# Patient Record
Sex: Female | Born: 1975 | Race: Black or African American | Hispanic: No | Marital: Married | Smoking: Current every day smoker
Health system: Southern US, Community
[De-identification: ages and names within clinical notes are randomized; demographics above are authoritative.]

## PROBLEM LIST (undated history)

## (undated) DIAGNOSIS — D649 Anemia, unspecified: Secondary | ICD-10-CM

## (undated) HISTORY — PX: TUBAL LIGATION: SHX77

---

## 1999-08-29 ENCOUNTER — Encounter: Admission: RE | Admit: 1999-08-29 | Discharge: 1999-08-29 | Payer: Self-pay | Admitting: Family Medicine

## 1999-09-04 ENCOUNTER — Encounter: Admission: RE | Admit: 1999-09-04 | Discharge: 1999-09-04 | Payer: Self-pay | Admitting: Family Medicine

## 1999-09-26 ENCOUNTER — Ambulatory Visit (HOSPITAL_COMMUNITY): Admission: RE | Admit: 1999-09-26 | Discharge: 1999-09-26 | Payer: Self-pay

## 1999-09-28 ENCOUNTER — Emergency Department (HOSPITAL_COMMUNITY): Admission: EM | Admit: 1999-09-28 | Discharge: 1999-09-28 | Payer: Self-pay | Admitting: Emergency Medicine

## 1999-10-09 ENCOUNTER — Encounter: Admission: RE | Admit: 1999-10-09 | Discharge: 1999-10-09 | Payer: Self-pay | Admitting: Family Medicine

## 1999-10-18 ENCOUNTER — Inpatient Hospital Stay (HOSPITAL_COMMUNITY): Admission: AD | Admit: 1999-10-18 | Discharge: 1999-10-18 | Payer: Self-pay | Admitting: Obstetrics

## 1999-11-07 ENCOUNTER — Encounter: Admission: RE | Admit: 1999-11-07 | Discharge: 1999-11-07 | Payer: Self-pay | Admitting: Family Medicine

## 1999-11-29 ENCOUNTER — Encounter: Admission: RE | Admit: 1999-11-29 | Discharge: 1999-11-29 | Payer: Self-pay | Admitting: Family Medicine

## 1999-12-07 ENCOUNTER — Ambulatory Visit (HOSPITAL_COMMUNITY): Admission: RE | Admit: 1999-12-07 | Discharge: 1999-12-07 | Payer: Self-pay

## 1999-12-11 ENCOUNTER — Encounter: Admission: RE | Admit: 1999-12-11 | Discharge: 1999-12-11 | Payer: Self-pay | Admitting: Sports Medicine

## 1999-12-29 ENCOUNTER — Encounter: Admission: RE | Admit: 1999-12-29 | Discharge: 1999-12-29 | Payer: Self-pay | Admitting: Sports Medicine

## 2000-01-01 ENCOUNTER — Ambulatory Visit (HOSPITAL_COMMUNITY): Admission: RE | Admit: 2000-01-01 | Discharge: 2000-01-01 | Payer: Self-pay

## 2000-01-20 ENCOUNTER — Inpatient Hospital Stay (HOSPITAL_COMMUNITY): Admission: AD | Admit: 2000-01-20 | Discharge: 2000-01-20 | Payer: Self-pay | Admitting: *Deleted

## 2000-01-22 ENCOUNTER — Encounter: Admission: RE | Admit: 2000-01-22 | Discharge: 2000-01-22 | Payer: Self-pay | Admitting: Family Medicine

## 2000-01-29 ENCOUNTER — Ambulatory Visit (HOSPITAL_COMMUNITY): Admission: RE | Admit: 2000-01-29 | Discharge: 2000-01-29 | Payer: Self-pay | Admitting: Sports Medicine

## 2000-02-01 ENCOUNTER — Encounter: Admission: RE | Admit: 2000-02-01 | Discharge: 2000-02-01 | Payer: Self-pay | Admitting: Family Medicine

## 2000-02-04 ENCOUNTER — Inpatient Hospital Stay (HOSPITAL_COMMUNITY): Admission: AD | Admit: 2000-02-04 | Discharge: 2000-02-04 | Payer: Self-pay | Admitting: Obstetrics & Gynecology

## 2000-02-08 ENCOUNTER — Encounter: Admission: RE | Admit: 2000-02-08 | Discharge: 2000-02-08 | Payer: Self-pay | Admitting: Family Medicine

## 2000-02-14 ENCOUNTER — Inpatient Hospital Stay (HOSPITAL_COMMUNITY): Admission: AD | Admit: 2000-02-14 | Discharge: 2000-02-14 | Payer: Self-pay | Admitting: Obstetrics

## 2000-02-15 ENCOUNTER — Encounter: Admission: RE | Admit: 2000-02-15 | Discharge: 2000-02-15 | Payer: Self-pay | Admitting: Family Medicine

## 2000-02-15 ENCOUNTER — Ambulatory Visit (HOSPITAL_COMMUNITY): Admission: RE | Admit: 2000-02-15 | Discharge: 2000-02-15 | Payer: Self-pay | Admitting: Obstetrics

## 2000-02-26 ENCOUNTER — Inpatient Hospital Stay (HOSPITAL_COMMUNITY): Admission: AD | Admit: 2000-02-26 | Discharge: 2000-02-29 | Payer: Self-pay | Admitting: Obstetrics

## 2000-02-26 ENCOUNTER — Encounter (INDEPENDENT_AMBULATORY_CARE_PROVIDER_SITE_OTHER): Payer: Self-pay | Admitting: Specialist

## 2000-03-01 ENCOUNTER — Inpatient Hospital Stay (HOSPITAL_COMMUNITY): Admission: AD | Admit: 2000-03-01 | Discharge: 2000-03-01 | Payer: Self-pay | Admitting: Obstetrics & Gynecology

## 2000-04-12 ENCOUNTER — Other Ambulatory Visit: Admission: RE | Admit: 2000-04-12 | Discharge: 2000-04-12 | Payer: Self-pay | Admitting: Gynecology

## 2000-04-12 ENCOUNTER — Encounter: Admission: RE | Admit: 2000-04-12 | Discharge: 2000-04-12 | Payer: Self-pay | Admitting: Family Medicine

## 2000-07-16 ENCOUNTER — Encounter: Admission: RE | Admit: 2000-07-16 | Discharge: 2000-07-16 | Payer: Self-pay | Admitting: Family Medicine

## 2000-07-16 ENCOUNTER — Other Ambulatory Visit: Admission: RE | Admit: 2000-07-16 | Discharge: 2000-07-16 | Payer: Self-pay | Admitting: Family Medicine

## 2000-07-30 ENCOUNTER — Encounter: Admission: RE | Admit: 2000-07-30 | Discharge: 2000-07-30 | Payer: Self-pay | Admitting: Family Medicine

## 2000-09-10 ENCOUNTER — Encounter: Admission: RE | Admit: 2000-09-10 | Discharge: 2000-09-10 | Payer: Self-pay | Admitting: Family Medicine

## 2000-09-17 ENCOUNTER — Ambulatory Visit (HOSPITAL_COMMUNITY): Admission: RE | Admit: 2000-09-17 | Discharge: 2000-09-17 | Payer: Self-pay

## 2000-09-19 ENCOUNTER — Encounter: Admission: RE | Admit: 2000-09-19 | Discharge: 2000-09-19 | Payer: Self-pay | Admitting: Sports Medicine

## 2000-09-20 ENCOUNTER — Ambulatory Visit (HOSPITAL_COMMUNITY): Admission: RE | Admit: 2000-09-20 | Discharge: 2000-09-20 | Payer: Self-pay | Admitting: *Deleted

## 2000-09-25 ENCOUNTER — Encounter: Admission: RE | Admit: 2000-09-25 | Discharge: 2000-09-25 | Payer: Self-pay | Admitting: Family Medicine

## 2000-11-22 ENCOUNTER — Other Ambulatory Visit: Admission: RE | Admit: 2000-11-22 | Discharge: 2000-11-22 | Payer: Self-pay | Admitting: *Deleted

## 2000-11-22 ENCOUNTER — Encounter: Admission: RE | Admit: 2000-11-22 | Discharge: 2000-11-22 | Payer: Self-pay | Admitting: Obstetrics & Gynecology

## 2000-11-22 ENCOUNTER — Encounter (INDEPENDENT_AMBULATORY_CARE_PROVIDER_SITE_OTHER): Payer: Self-pay | Admitting: *Deleted

## 2001-05-12 ENCOUNTER — Emergency Department (HOSPITAL_COMMUNITY): Admission: EM | Admit: 2001-05-12 | Discharge: 2001-05-13 | Payer: Self-pay

## 2001-05-13 ENCOUNTER — Encounter: Payer: Self-pay | Admitting: Emergency Medicine

## 2001-05-13 ENCOUNTER — Emergency Department (HOSPITAL_COMMUNITY): Admission: EM | Admit: 2001-05-13 | Discharge: 2001-05-13 | Payer: Self-pay | Admitting: Emergency Medicine

## 2001-12-22 ENCOUNTER — Emergency Department (HOSPITAL_COMMUNITY): Admission: EM | Admit: 2001-12-22 | Discharge: 2001-12-22 | Payer: Self-pay | Admitting: Emergency Medicine

## 2001-12-28 ENCOUNTER — Emergency Department (HOSPITAL_COMMUNITY): Admission: EM | Admit: 2001-12-28 | Discharge: 2001-12-28 | Payer: Self-pay | Admitting: Emergency Medicine

## 2002-01-25 ENCOUNTER — Emergency Department (HOSPITAL_COMMUNITY): Admission: EM | Admit: 2002-01-25 | Discharge: 2002-01-25 | Payer: Self-pay | Admitting: *Deleted

## 2002-01-25 ENCOUNTER — Encounter: Payer: Self-pay | Admitting: *Deleted

## 2002-02-25 ENCOUNTER — Encounter: Admission: RE | Admit: 2002-02-25 | Discharge: 2002-02-25 | Payer: Self-pay | Admitting: Family Medicine

## 2002-08-27 ENCOUNTER — Encounter: Admission: RE | Admit: 2002-08-27 | Discharge: 2002-08-27 | Payer: Self-pay | Admitting: Family Medicine

## 2003-02-17 ENCOUNTER — Emergency Department (HOSPITAL_COMMUNITY): Admission: EM | Admit: 2003-02-17 | Discharge: 2003-02-17 | Payer: Self-pay | Admitting: Emergency Medicine

## 2003-02-24 ENCOUNTER — Encounter: Admission: RE | Admit: 2003-02-24 | Discharge: 2003-02-24 | Payer: Self-pay | Admitting: Sports Medicine

## 2003-05-07 ENCOUNTER — Emergency Department (HOSPITAL_COMMUNITY): Admission: EM | Admit: 2003-05-07 | Discharge: 2003-05-07 | Payer: Self-pay | Admitting: Emergency Medicine

## 2003-12-29 ENCOUNTER — Ambulatory Visit: Payer: Self-pay | Admitting: Family Medicine

## 2004-01-23 ENCOUNTER — Encounter (INDEPENDENT_AMBULATORY_CARE_PROVIDER_SITE_OTHER): Payer: Self-pay | Admitting: *Deleted

## 2004-01-28 ENCOUNTER — Ambulatory Visit: Payer: Self-pay | Admitting: Family Medicine

## 2004-03-01 ENCOUNTER — Ambulatory Visit: Payer: Self-pay | Admitting: Family Medicine

## 2005-03-22 ENCOUNTER — Ambulatory Visit (HOSPITAL_BASED_OUTPATIENT_CLINIC_OR_DEPARTMENT_OTHER): Admission: RE | Admit: 2005-03-22 | Discharge: 2005-03-22 | Payer: Self-pay | Admitting: Orthopedic Surgery

## 2006-03-22 ENCOUNTER — Encounter (INDEPENDENT_AMBULATORY_CARE_PROVIDER_SITE_OTHER): Payer: Self-pay | Admitting: *Deleted

## 2006-05-21 ENCOUNTER — Emergency Department (HOSPITAL_COMMUNITY): Admission: EM | Admit: 2006-05-21 | Discharge: 2006-05-21 | Payer: Self-pay | Admitting: Emergency Medicine

## 2007-06-23 ENCOUNTER — Telehealth: Payer: Self-pay | Admitting: *Deleted

## 2007-08-19 ENCOUNTER — Ambulatory Visit: Payer: Self-pay | Admitting: Family Medicine

## 2007-08-19 DIAGNOSIS — M654 Radial styloid tenosynovitis [de Quervain]: Secondary | ICD-10-CM

## 2007-08-19 DIAGNOSIS — F329 Major depressive disorder, single episode, unspecified: Secondary | ICD-10-CM

## 2007-08-19 DIAGNOSIS — E669 Obesity, unspecified: Secondary | ICD-10-CM

## 2007-09-25 ENCOUNTER — Ambulatory Visit: Payer: Self-pay | Admitting: Family Medicine

## 2007-09-25 ENCOUNTER — Telehealth (INDEPENDENT_AMBULATORY_CARE_PROVIDER_SITE_OTHER): Payer: Self-pay | Admitting: Family Medicine

## 2007-10-10 ENCOUNTER — Emergency Department (HOSPITAL_COMMUNITY): Admission: EM | Admit: 2007-10-10 | Discharge: 2007-10-10 | Payer: Self-pay | Admitting: Emergency Medicine

## 2007-10-23 ENCOUNTER — Ambulatory Visit: Payer: Self-pay | Admitting: Family Medicine

## 2007-10-23 DIAGNOSIS — F17200 Nicotine dependence, unspecified, uncomplicated: Secondary | ICD-10-CM

## 2007-11-24 ENCOUNTER — Telehealth (INDEPENDENT_AMBULATORY_CARE_PROVIDER_SITE_OTHER): Payer: Self-pay | Admitting: Family Medicine

## 2009-06-06 ENCOUNTER — Emergency Department (HOSPITAL_COMMUNITY): Admission: EM | Admit: 2009-06-06 | Discharge: 2009-06-06 | Payer: Self-pay | Admitting: Emergency Medicine

## 2010-01-10 ENCOUNTER — Ambulatory Visit: Payer: Self-pay | Admitting: Family Medicine

## 2010-01-10 ENCOUNTER — Encounter: Payer: Self-pay | Admitting: *Deleted

## 2010-01-10 ENCOUNTER — Encounter
Admission: RE | Admit: 2010-01-10 | Discharge: 2010-01-10 | Payer: Self-pay | Source: Home / Self Care | Attending: Family Medicine | Admitting: Family Medicine

## 2010-01-10 ENCOUNTER — Encounter: Payer: Self-pay | Admitting: Family Medicine

## 2010-01-10 DIAGNOSIS — R609 Edema, unspecified: Secondary | ICD-10-CM

## 2010-01-10 DIAGNOSIS — R51 Headache: Secondary | ICD-10-CM

## 2010-01-10 DIAGNOSIS — R5381 Other malaise: Secondary | ICD-10-CM | POA: Insufficient documentation

## 2010-01-10 DIAGNOSIS — R519 Headache, unspecified: Secondary | ICD-10-CM | POA: Insufficient documentation

## 2010-01-10 DIAGNOSIS — R5383 Other fatigue: Secondary | ICD-10-CM

## 2010-01-10 LAB — CONVERTED CEMR LAB
BUN: 12 mg/dL (ref 6–23)
Basophils Absolute: 0 10*3/uL (ref 0.0–0.1)
Basophils Relative: 1 % (ref 0–1)
CO2: 21 meq/L (ref 19–32)
Calcium: 9.1 mg/dL (ref 8.4–10.5)
Chloride: 107 meq/L (ref 96–112)
Creatinine, Ser: 0.7 mg/dL (ref 0.40–1.20)
Eosinophils Absolute: 0.3 10*3/uL (ref 0.0–0.7)
Eosinophils Relative: 3 % (ref 0–5)
Free T4: 1.01 ng/dL (ref 0.80–1.80)
Glucose, Bld: 107 mg/dL — ABNORMAL HIGH (ref 70–99)
HCT: 40.8 % (ref 36.0–46.0)
Hemoglobin: 12.5 g/dL (ref 12.0–15.0)
Lymphocytes Relative: 39 % (ref 12–46)
Lymphs Abs: 2.9 10*3/uL (ref 0.7–4.0)
MCHC: 30.6 g/dL (ref 30.0–36.0)
MCV: 92.5 fL (ref 78.0–100.0)
Monocytes Absolute: 0.4 10*3/uL (ref 0.1–1.0)
Monocytes Relative: 6 % (ref 3–12)
Neutro Abs: 3.9 10*3/uL (ref 1.7–7.7)
Neutrophils Relative %: 52 % (ref 43–77)
Platelets: 383 10*3/uL (ref 150–400)
Potassium: 4.2 meq/L (ref 3.5–5.3)
RBC: 4.41 M/uL (ref 3.87–5.11)
RDW: 15.7 % — ABNORMAL HIGH (ref 11.5–15.5)
Sodium: 141 meq/L (ref 135–145)
TSH: 1.291 microintl units/mL (ref 0.350–4.500)
WBC: 7.4 10*3/uL (ref 4.0–10.5)

## 2010-02-23 NOTE — Assessment & Plan Note (Signed)
Summary: Headache   Vital Signs:  Patient profile:   35 year old female Height:      60 inches Weight:      215.1 pounds BMI:     42.16 Temp:     97.9 degrees F oral Pulse rate:   112 / minute BP sitting:   128 / 86  (left arm) Cuff size:   regular  Vitals Entered By: Jimmy Footman, CMA (January 10, 2010 10:51 AM) CC: migrane x3 days. swelling under jaw Is Patient Diabetic? No Pain Assessment Patient in pain? yes     Location: headache Intensity: 6 Type: sharp   Primary Care Provider:  . [facility]  CC:  migrane x3 days. swelling under jaw.  History of Present Illness: 35 y/o F who has not been seen by [facility] in 2 yrs presents for Headache x 3-4 days.  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  She is at the computer all day long, 8 hrs a day.  Her work is not stressful because it is pretty routine, with [employer].   Neck edema: She also noticed lower jaw edema x 6-7 wks.  She describes it as "my face has spread".  Feels like jaw/neck is swollen.  No dysphagia, no hoarseness.  Hurts to eat, but not to swallow.  Tenderness in neck, not jaw.  Weight gain of 6 lbs since 4 months.  Heat intolerance: having to keep the fan on at work and to turn the windows down.  BMs have been normal, no constipation or diarrhea.  She normally has 4-5 BMs per day (soft stools).  Feels like she is more sluggish than normal.  She works out 30 min daily but has not been able to lose weight.     HEADACHE  Onset: 3-4 days ago Location: above left eye, no behind the eye Description: feels like constant sharp pain Modifying factors: feels better if she presses on inner corner of eye.  She has been taking Tylenol 1000mg  qid, which gave some mild relief, but HA would return after 25 minutes.  Symptoms Nausea/vomiting: no Photophobia: yes Phonophobia: depends on which type of noise, high pitch noises bother her the most.  She is used to loud noises in her work, and has been able  to work with HA. Tearing of eyes: yes, in left eye.  Feels like the left eye is "welting up" Sinus pain/pressure: no Relation to menstrual cycle: no  Red Flags Fever: no Neck pain/stiffness: no Vision/speech difficulty: think that left peripheral vision is less than normal x 2 wks Focal weakness or numbness: stiffness in fingers in AM X 74yr, had CTS release surgery Altered mental status: no Trauma: no Worse in am: no Anticoagulant use: no Immunocompromise: no  Family history of Migraine: no  Tobacco: 7-8 cig/day x 18-20 yrs .  Wants to quit.    Current Medications (verified): 1)  Ventolin Hfa 108 (90 Base) Mcg/act Aers (Albuterol Sulfate) .Marland Kitchen.. 1-2 Puffs Inhaled 4 Times Daily As Needed For Shortness of Breath 2)  Aminofen 500 Mg Tabs (Acetaminophen) .... 2 Tabs By Mouth Four Times As Needed Headache  Allergies (verified): No Known Drug Allergies  Past History:  Social History: Last updated: 03/21/2006 Works at [employer] [place].  Married with 2 kids, 7&34 yo.  Trying to quit smoking.  On weight watchers.  Joined a gym.; Daughter has autism.  Husband now works in [employer].  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 Vitals reviewed.  Head:  normocephalic and atraumatic.   Mouth:  Oral mucosa and oropharynx without lesions or exudates.  Teeth in good repair. Neck:  No deformities, masses, or tenderness noted.no thyromegaly, no thyroid nodules or tenderness, no cervical lymphadenopathy, and no neck tenderness.   Lungs:  Normal respiratory effort, chest expands symmetrically. Lungs are clear to auscultation, no crackles or wheezes. Heart:  Normal rate  and regular rhythm. S1 and S2 normal without gallop, murmur, click, rub or other extra sounds. Pulses:  +2 bilaterally  Extremities:  NO edema  Neurologic:  No cranial nerve deficits noted. Station and gait are normal. Plantar reflexes are down-going bilaterally. DTRs are symmetrical throughout. Sensory, motor and coordinative functions appear intact.strength normal in all extremities, gait normal, DTRs symmetrical and normal, finger-to-nose normal, and heel-to-shin normal.   Skin:  some hyperpigmented skin around skinfold of neck, color is dark brown/black  Cervical Nodes:  No lymphadenopathy noted Axillary Nodes:  No palpable lymphadenopathy Psych:  Oriented X3.     Impression & Recommendations:  Problem # 1:  HEADACHE (ICD-784.0) Assessment New Headache x 3-4 days.  No real red flags on ROS.  Neuro exam intact.  HA with some migraine features (photophobia, phonophobia), but no nuasea/vomiting.  Will try otc migraine med for now (tylenol+ASA+caffeine, like Exedrine).  Pt to call me in 1-2 days if no improvement, then I will try imitrex.   The following medications were removed from the medication list:    Diclofenac Sodium 50 Mg Tbec (Diclofenac sodium) .Marland Kitchen... 1 tab by mouth q12h for wrist pain Her updated medication list for this problem includes:    Aminofen 500 Mg Tabs (Acetaminophen) .Marland Kitchen... 2 tabs by mouth four times as needed headache  Orders: Basic Met-[facility] ([id number]) CBC w/Diff-[facility] (09811) TSH-[facility] [phone]) Free T4-[facility] ([id number]) [facility]- Est  Level 4 (96295)  Problem # 2:  EDEMA (ICD-782.3) Assessment: New Pt has sensation of swelling or fullness to neck/jaw areas x 6-7 months.  ROS showed concerns for thyroid dysfunction.  Exam did not show palpable masss/nodules in parotid glands or mandibular glands.  Will check TSH.  Will get plain film of neck/jaw, which may show a stone in the parotid/mandibular gland.    Orders: Radiology other (Radiology Other) [facility]- Est  Level 4  (28413)  Problem # 3:  FATIGUE (ICD-780.79) Assessment: New Pt with weight gain of 6 lbs in 4 lbs despite exercising 30 minutes daily.  She also endorses feeling "sluggish".  Will rule out reversible causes like electrolytes, thyroid, anemia.    I do not believe that depression is the cause of her fatigue.  If above tests are wnl, will inquire about sleep, appetite, depression.    Orders: Basic Met-[facility] [phone]) CBC w/Diff-[facility] [phone]) TSH-[facility] [phone]) Free T4-[facility] [phone]) [facility]- Est  Level 4 (51884)  Problem # 4:  SMOKER (ICD-305.1) Assessment: Comment Only Pt is interested in smoking cessation.  She never tried the Chantix that was Rx in 2009 because she did not have the money to pay fo rit.  Discussed Smoking Cessation Clinic with Dr Raymondo Band when she is ready.    The following medications were removed from the medication list:    Chantix Starting Month Pak 0.5 Mg X 11 &  1 Mg X 42 Misc (Varenicline tartrate) .Marland Kitchen... Take as directed on dose pack. dispense 1 month supply  Complete Medication List: 1)  Ventolin Hfa 108 (90 Base) Mcg/act Aers (Albuterol sulfate) .Marland Kitchen.. 1-2 puffs inhaled 4 times daily as needed for shortness of breath 2)  Aminofen 500 Mg Tabs (Acetaminophen) .... 2 tabs by mouth four times as needed headache  Patient Instructions: 1)  Try taking over the counter medicine for your headache.  You can try exedrine migraine.  Call me back Wed or Thurs if headache is not better. 2)  We will discuss your labs after I get results in.     Orders Added: 1)  Basic Met-[facility] [[id number]] 2)  CBC w/Diff-[facility] [85025] 3)  TSH-[facility] [[id number]] 4)  Free T4-[facility] [[id number]] 5)  Radiology other [Radiology Other] 6)  [facility]- Est  Level 4 [41324]

## 2010-02-23 NOTE — Letter (Signed)
Summary: Out of Work  [facility]  [address]   [place], [place] [address]   Phone: [phone]  Fax: [phone]    January 10, 2010   Employee:  ELLISHA [employer]    To Whom It May Concern:   For Medical reasons, please excuse the above named employee from work for the following dates:  January 10, 2010    If you need additional information, please feel free to contact our office.         Sincerely,    Jimmy Footman, CMA

## 2010-03-10 ENCOUNTER — Encounter: Payer: Self-pay | Admitting: *Deleted

## 2010-06-02 ENCOUNTER — Emergency Department (HOSPITAL_COMMUNITY)
Admission: EM | Admit: 2010-06-02 | Discharge: 2010-06-03 | Disposition: A | Discharge status: Home / Self Care | Payer: Self-pay | Attending: Emergency Medicine | Admitting: Emergency Medicine

## 2010-06-02 DIAGNOSIS — R609 Edema, unspecified: Secondary | ICD-10-CM | POA: Insufficient documentation

## 2010-06-02 DIAGNOSIS — M7989 Other specified soft tissue disorders: Secondary | ICD-10-CM | POA: Insufficient documentation

## 2010-06-02 DIAGNOSIS — R0602 Shortness of breath: Secondary | ICD-10-CM | POA: Insufficient documentation

## 2010-06-02 DIAGNOSIS — I872 Venous insufficiency (chronic) (peripheral): Secondary | ICD-10-CM | POA: Insufficient documentation

## 2010-06-02 LAB — DIFFERENTIAL
Basophils Absolute: 0 10*3/uL (ref 0.0–0.1)
Eosinophils Absolute: 0.3 10*3/uL (ref 0.0–0.7)
Eosinophils Relative: 3 % (ref 0–5)
Lymphs Abs: 3 10*3/uL (ref 0.7–4.0)

## 2010-06-02 LAB — BASIC METABOLIC PANEL
BUN: 8 mg/dL (ref 6–23)
Calcium: 8.7 mg/dL (ref 8.4–10.5)
GFR calc non Af Amer: >60 mL/min (ref >60)
Potassium: 3.4 mEq/L — ABNORMAL LOW (ref 3.5–5.1)

## 2010-06-02 LAB — D-DIMER, QUANTITATIVE: D-Dimer, Quant: 0.45 ug/mL-FEU (ref 0.00–0.48)

## 2010-06-02 LAB — CBC
MCV: 85 fL (ref 78.0–100.0)
Platelets: 398 10*3/uL (ref 150–400)
RDW: 15.2 % (ref 11.5–15.5)
WBC: 9 10*3/uL (ref 4.0–10.5)

## 2010-06-02 LAB — POCT PREGNANCY, URINE: Preg Test, Ur: NEGATIVE

## 2010-06-09 NOTE — Op Note (Signed)
NAMEDANYETTA, GILLHAM             ACCOUNT NO.:  [id number]   MEDICAL RECORD NO.:  [id number]          PATIENT TYPE:  AMB   LOCATION:  [facility]                          FACILITY:  [facility]   PHYSICIAN:  Katy Fitch. Sypher, M.D. DATE OF BIRTH:  [birth year]   DATE OF PROCEDURE:  03/22/2005  DATE OF DISCHARGE:                                 OPERATIVE REPORT   PREOPERATIVE DIAGNOSIS:  Chronic right wrist ulnar-sided pain dating back at  least four months and with documented past history of wrist pain dating back  to December 2003.   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   OPERATION:  1.  Examination of right wrist under anesthesia.  2.  Diagnostic arthroscopy of right radiocarpal and ulnocarpal joints with      identification of a chronic peripheral tear of the triangular      fibrocartilage with elements of acute traumatic extension dorsally and      exposure of pisotriquetral joint, suggesting degenerative pathology of      triangular fibrocartilage in a more ulnar and palmar aspect.  3.  Is debridement and repair of peripheral triangular fibrocartilage tear      with deferral of ulnar shortening based on lack of evidence of chronic      traditional ulnocarpal abutment.   OPERATING SURGEON:  Katy Fitch. Sypher, M.D.   ASSISTANT:  Molly Maduro Dasnoit PA-C.   ANESTHESIA:  General by LMA, supervising anesthesiologist is Dr. Noreene Larsson.   INDICATIONS:  Truc Winfree is a 35 year old right-hand dominant woman who  is employed by the [employer].   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   In December 2003 she was referred by Dr. Leveda Anna of the [facility] for evaluation and management of a painful right wrist.   At that time she was noted to have signs of first dorsal compartment  tenosynovitis and fourth dorsal compartment tenosynovitis.  I recommended a  trial of splinting and anti-inflammatory medication.   Ms. Kopf did not return for follow-up evaluation and later presented for  examination by her family practice physicians.   She was referred to see Dr. Margaretha Sheffield at [facility] by the [facility]  family practice staff for evaluation of increasing ulnar-sided pain and  sense of crepitation and popping with wrist motion, in particular ulnar  deviation or load-bearing with pronation-supination.   She was referred for an upper extremity orthopedic consult on February 26, 2005, and on examination was noted to have signs of a possible ulnocarpal  abutment and pathology of the triangular fibrocartilage.  Dr. Margaretha Sheffield had  ordered an MRI arthrogram that was accomplished by Dr. Jena Gauss, the  radiologist, on February 14, 2005.   Dr. Jena Gauss noted a leak through the peripheral triangular fibrocartilage at  the time of contrast injection and upon review the MRI findings identified  pathology in the peripheral triangular fibrocartilage with  a normal radial  attachment and normal central triangular fibrocartilage.  There was no sign  of a dissociative intercarpal ligament instability and no sign of an  extrinsic ligament abnormality.   Dr. Margaretha Sheffield referred Ms. Mullins for definitive hand care.   During her consultation I recommended that we study her wrist for signs of  chronic ulnocarpal abutment.  A neutral PA and lateral film of her right  wrist was obtained documenting that she is approximately 2 mm ulnar-  positive.   We recommended proceeding with diagnostic arthroscopy, anticipating  debridement and possible repair the triangular fibrocartilage and if chronic  abutment signs were identified with damage to the central triangular  fibrocartilage and chondromalacia noted on the ulnar head  and/or  lunate/triquetrum or an LT tear noted, we recommended consideration of  possible ulnar shortening and plate fixation.   Preoperatively, Ms. Mccalister was advised of her predicament in the office  and we conducted a second informed consent with Ms. Bickert, her husband  and her mother-in-law in the holding area.   Questions were invited and answered preoperatively.   PROCEDURE:  Shantell Belongia was brought to the operating room and placed in  supine position on the operating table.   Following the induction of general anesthesia by LMA technique, the right  arm was prepped with Betadine soap and solution and sterilely draped.  Ancef  1 g was administered as an IV prophylactic antibiotic.   The procedure commenced with routine Betadine scrub and paint of the right  arm, followed by sterile draping with impervious arthroscopy drapes and  examination of the right arm with an Esmarch bandage and inflation of an  arterial tourniquet to 250 mmHg on the proximal brachium due to mild  systolic hypertension.   The procedure commenced with examination of the wrist under anesthesia.  The  preoperative popping was still present in the ulnar-carpal articulation.  The distal radioulnar joint was stable.   The right wrist was then distracted by application of finger traps on the  index and long fingers, counter traction on the forearm and use of an  arthroscopy tower designed for wrist arthroscopy to apply 10 pounds of  traction.   The 3/4 portal was sounded with an 18-gauge needle and the scope placed with  blunt technique.  Diagnostic arthroscopy revealed intact hyaline articular  cartilage surfaces on the radius, scaphoid, lunate and triquetrum.  The  scapholunate interosseous ligament and the lunotriquetral-interosseous  ligament were noted be normal.   A 5/6 portal was created to allow use of a nerve hook to palpate the triangular fibrocartilage.   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  There appeared to be  a more acute dorsal extension of this tear that appeared traumatic or more  recent in nature.   The scope was placed in the 5/6 portal and the lunate and triquetral  articular surfaces carefully studied.  There was no sign of a significant  hyaline cartilage injury due to chronic abutment.  Likewise, the ulnolunate  and ulnotriquetral ligaments were noted be sound except for the ulnar aspect  of the ulnotriquetral ligament.  There was a gap were the pisotriquetral  joint was noted to be visible.  There were several areas of degenerative  change in the triangular fibrocartilage, raising the question of whether or  not steroids  had been applied to this region by another physician earlier.   We ultimately replaced the scope in the 3/4 portal and performed an  extensive debridement of the degenerative and traumatic TFCC tear.   Given the absence of ulnocarpal abutment signs, there were no indications in  my judgment for shortening the ulna at this time.   We recommended an attempt to repair the acute portion of the triangular  fibrocartilage tear and do our best with the chronic tear by debridement and  positioning postop, hoping that scar will form stabilizing the triangular  fibrocartilage.   With the aid of an 18-gauge needle, an outside-in technique was used to  place a mattress suture of 3-0 FiberWire, closing the central portion of the  triangular fibrocartilage tear.   Excellent opposition was achieved.  An incision was fashioned along the  distal ulna assuring that the extensor carpi ulnaris and the dorsal ulnar  sensory branch were not trapped beneath the suture.   The suture was tied to the floor of the sixth dorsal compartment and the  scope was used to monitor tension on the  suture as it was tied.   Ms. Jiles's wounds were then irrigated, the arthroscope removed, and the  portals repaired with intradermal 3-0 Prolene.  The incision fashioned for  suture-tying was also closed with an intradermal 3-0 Prolene.   There were no apparent complications.   Given the complex nature of this triangular fibrocartilage tear and the  presence of significant degenerative changes in the triangular  fibrocartilage, the long-term prognosis of this predicament is very  uncertain.   At this point in time, I do not see an indication for ulnar shortening.  There may be indications for ligamentous reconstruction of the triangular  fibrocartilage at a later date should this not to work adequately.   Please sign this dictation Josephine Igo.      Katy Fitch Sypher, M.D.  Electronically Signed     RVS/MEDQ  D:  03/22/2005  T:  03/22/2005  Job:  [id number]  cc:   Frazier Butt, D.O.  Fax: [phone]
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
## 2010-06-09 NOTE — Op Note (Signed)
[facility]  Patient:    Barbara Barrera, Barbara Barrera                    MRN: [id number] Proc. Date: 02/26/00 Adm. Date:  [id number] Attending:  Tammi Sou                           Operative Report  PREOPERATIVE DIAGNOSIS:       Elective repeat cesarean section, desires                               sterilization.  POSTOPERATIVE DIAGNOSIS:      Elective repeat cesarean section, desires                               sterilization.  OPERATION/PROCEDURE:          Repeat low transverse cesarean section and                               bilateral partial salpingectomy ([place]                               technique).  SURGEON:                      Charles A. Clearance Coots, M.D.  ASSISTANT:                    Roseanna Rainbow, M.D.  ANESTHESIA:                   Spinal anesthesia.  ESTIMATED BLOOD LOSS:         800 mL.  COMPLICATIONS:                None.  SPECIMEN:                     Approximately 2 cm segments of right and left                               fallopian tube.  DESCRIPTION OF PROCEDURE:     The patient was brought to the operating room and after satisfactory spinal anesthesia, the abdomen was prepped and draped in the usual sterile fashion.  A Pfannenstiel skin incision was made through the previous scar down to the fascia. The fascia was nicked in the midline and the fascial incision was extended to the left and to the right with curved Mayo scissors. The superior and inferior fascial edges were taken off of the rectus muscle with both blunt and sharp dissection. The rectus muscle was bluntly and sharply divided in the midline and the peritoneal was entered digitally and was digitally extended to the left and to the right. The bladder blade was positioned and the vesicouterine fold of peritoneum was grasped with forceps and was incised and undermined with Metzenbaum scissors.  Incision was extended to the left and to the right  with Metzenbaum scissors.  The bladder flap was bluntly developed and the bladder blade was repositioned in front of the urinary bladder, placing it well out of the operative field. The uterus was then entered  transversely in the lower uterine segment with the scalpel and moderate amount of clear fluid was expelled.  The uterine incision was then extended to the left and to the right with bandage scissors.  The vertex was then delivered with the aid of fundal pressure from the assistant and the delivery was completed with the aid of fundal pressure from the assistant. The infants mouth and nose were suctioned with a suction bulb and the umbilical cord was doubly clamped and the infant was handed off to the nursery staff.  The cord blood was obtained and the placenta was spontaneously expelled from the uterine cavity intact.  The uterus was then exteriorized and the endometrial surface was thoroughly debrided with a dry lap sponge.  The edges of the uterine incision were grasped with ring forceps and the uterus was closed with continuous interlocking suture of 0 Monocryl.  Hemostasis was excellent.  Attention was then turned above to the tubal ligation procedure. The right fallopian tube was identified and was grasped with the Babcock clamp.  The tube was identified from the cornual end to the fimbrial end serially and grasped with Babcock clamps and the knuckle of tube beneath the Babcock clamp and the isthmic area of the tube was doubly ligated with #1 plain catgut and the section of tube above the knot was excised and submitted to pathology for evaluation.  There was no active bleeding at the conclusion of the procedure.  The same procedure was performed on the opposite side without complications.  The uterus was then placed back in its normal anatomic position.  There was no active bleeding from the closure of the uterus or from the tubal ligation procedure.  The abdomen was then closed  as follows.  The rectus approximated with interrupted suture of 0 Monocryl, the fascia was closed with continuous suture of 0 PDS from each corner to the center, subcutaneous tissue was thoroughly irrigated with warm saline solution and all areas of subcutaneous bleeding were coagulated with the Bovie.  Skin was then approximated with surgical stainless steel staples.  The surgical technician indicated that all sponge, needle and instrument counts were correct.  A sterile bandage was applied to the incision closure and the patient was transported to the recovery room in satisfactory condition. DD:  02/26/00 TD:  02/27/00 Job: [id number]

## 2010-06-09 NOTE — Discharge Summary (Signed)
[facility]  Patient:    Barbara Barrera, Barbara Barrera                    MRN: [id number] Adm. Date:  [id number] Disc. Date: [id number] Attending:  Antionette Char Dictator:   Zella Ball, M.D.                           Discharge Summary  DISCHARGE DIAGNOSES:          1. Intrauterine pregnancy delivered.                               2. Low transverse cesarean section secondary to                                  history of previous low transverse cesarean                                  section and declines trial of labor.                               3. Status post bilateral tubal ligation                                  secondary to desire for sterilization.  PROCEDURE:                    1. Elective repeat cesarean section.                               2. Bilateral tubal ligation.  DISCHARGE MEDICATIONS:        1. Percocet 5/325 one pill q.4h. p.r.n. pain.                               2. Ibuprofen 800 mg one pill q.6h. p.r.n. pain.                               3. Colace 100 mg one pill b.i.d. p.r.n.                                  constipation.                               4. Prenatal vitamins.  PRESENTING HISTORY:           This is a 35 year old G2, P1-0-0-1 who presented at [redacted] weeks gestation with a history of previous low transverse cesarean section who declined a trial of labor.  PAST MEDICAL HISTORY:         Patient had a history of major depressive disorder as well as her prior history of a cesarean section.  SOCIAL HISTORY:               Patient does smoke cigarettes during the pregnancy.  PHYSICAL EXAMINATION  VITAL SIGNS:                  Vital signs stable.  Blood pressure 110/70.  GENERAL:                      This is a well-appearing black female in no acute distress.  HEENT:                        Normocephalic, atraumatic.  LUNGS:                        Clear to auscultation bilaterally.  CARDIAC:                       Regular rate and rhythm.  ABDOMEN:                      Gravid, nontender.  PELVIC:                       Deferred.                                Patient was admitted for repeat low transverse cesarean section.  HOSPITAL COURSE:              Patient was brought back to the operating room on February 26, 2000 and a repeat cesarean section as well as bilateral tubal ligation was performed by Dr. Coral Ceo and Dr. Antionette Char.  For full dictation please see dictation of that date.  Patient was delivered of a viable female at 9:29 in the morning on February 26, 2000.  Apgars 8 at one minute, 9 at five minutes.  Infants weight was 5 pounds 12 ounces.  Patient had an essentially uncomplicated postoperative course and was discharged home on day #3 postoperative.  LABORATORIES:                 White count 7200, hemoglobin 10.2, platelet counts 151,000 on February 27, 2000.  FOLLOW-UP:                    Patient was to follow-up at the [facility] in two to three days after discharge for staple removal.  Second followup appointment was with womens health six weeks postpartum. DD:  04/17/00 TD:  04/17/00 Job: [id number]

## 2011-02-13 ENCOUNTER — Telehealth: Payer: Self-pay | Admitting: Family Medicine

## 2011-02-13 NOTE — Telephone Encounter (Signed)
Spoke with patient and informed her that we do not have any immunizations on record for her. She stated that she has just started to work for [employer] and that they requested one. I has informed to to tey to find out which one's exactly they are looking for.

## 2011-02-13 NOTE — Telephone Encounter (Signed)
Patient needs a copy of her shot record.  She would like to pick it up this afternoon.

## 2011-10-17 ENCOUNTER — Telehealth: Payer: Self-pay | Admitting: Sports Medicine

## 2011-10-17 ENCOUNTER — Ambulatory Visit (INDEPENDENT_AMBULATORY_CARE_PROVIDER_SITE_OTHER): Payer: 59 | Admitting: Sports Medicine

## 2011-10-17 ENCOUNTER — Encounter: Payer: Self-pay | Admitting: Sports Medicine

## 2011-10-17 VITALS — BP 122/92 | HR 85 | Temp 97.8°F | Ht 60.0 in | Wt 197.5 lb

## 2011-10-17 DIAGNOSIS — F329 Major depressive disorder, single episode, unspecified: Secondary | ICD-10-CM

## 2011-10-17 DIAGNOSIS — R0602 Shortness of breath: Secondary | ICD-10-CM

## 2011-10-17 DIAGNOSIS — Z0000 Encounter for general adult medical examination without abnormal findings: Secondary | ICD-10-CM

## 2011-10-17 DIAGNOSIS — R609 Edema, unspecified: Secondary | ICD-10-CM

## 2011-10-17 DIAGNOSIS — R5383 Other fatigue: Secondary | ICD-10-CM

## 2011-10-17 DIAGNOSIS — R5381 Other malaise: Secondary | ICD-10-CM

## 2011-10-17 DIAGNOSIS — F17200 Nicotine dependence, unspecified, uncomplicated: Secondary | ICD-10-CM

## 2011-10-17 MED ORDER — BUPROPION HCL ER (SR) 150 MG PO TB12: ORAL_TABLET | ORAL | Status: DC

## 2011-10-17 NOTE — Patient Instructions (Addendum)
It was nice to meet you.  We discussed a lot today.  1.  Call 1 800-QUIT-NOW to re-establish with them.  They will be a great resource for you especially late at night when at work. 2.  I have called in a prescription for Wellbutrin to help with your depression, smoking and chronic pain that you experience. 3. Pick up a pair of compression socks from the running section of [employer]; put these on first thing in the morning before your feet hit the floor to help with your swelling and cramping. 4. I would like for you to come some time in the next 1 week to get labs drawn first thing in the morning to check your blood levels.  Please follow up with me in 2 weeks.

## 2011-10-17 NOTE — Assessment & Plan Note (Addendum)
Will start Wellbutrin given co-exisiting smoking dependence and chronic pain. Reviewed red flags and discussed black boxed warning

## 2011-10-17 NOTE — Progress Notes (Signed)
  [facility]  Patient name: Barbara Barrera MRN [id number]  Date of birth: July 02, 1975  CC & HPI:  Barbara Barrera is a 36 y.o. female presenting today to follow up her fibromyalgia and establish care with myself.  Reports her chronic pain is secondary to concurrent depression symptoms and tobacco dependence.  We will discuss her pain indepth at further visits.  Denies radicular leg symptoms, no change in bowel/bladder, no falls.  Depression is stable but affecting herlife significantly.  See objective for more.  Reports LE swelling at end of day that has been progressively worsening over the past 1-2 years  Tobacco use.  3-4 cig/day would really like to quit.  Interested in medicines to help.  ROS:  Per HPI  Pertinent History Reviewed:  Medical & Surgical Hx:  Reviewed: Significant for Obesity, depression, fatigue Medications: Reviewed & Updated - see associated section Social History: Reviewed - Significant for current everyday smoker  Objective Findings:  Vitals:  Filed Vitals:   10/17/11 1112  BP: 122/92  Pulse: 85  Temp: 97.8 F (36.6 C)    PE: GENERAL:  Obese AA female. In no discomfort; no respiratory distress. H&N: AT/[place], trachea midline EENT:  MMM, no scleral icterus, EOMi HEART: RRR, S1/S2 heard, no murmur LUNGS: CTA B, no wheezes, no crackles EXTREMITIES: Moves all 4 extremities spontaneously, warm well perfused, 1+/4 B edema, bilateral DP and PT pulses 2/4.   PSYCH: Alert and appropriately interactive; Insight:Good   PHQ-9 - Difficulty: Extremely Difficuly 1 2 3 4 5 6 7 8 9  Total:  3 3 3 3 3 3 3 1 1 24   Hx of suicidality and suicide attempt.  Denies any current thoughts of hurting herself or having a plan.  Does report she has too much to live for and feels suicide would be a very selfish thing to do at this time.  States with suicide attempt >38yrs ago prior to having a family and that she would never do this now.    Assessment &  Plan:

## 2011-10-17 NOTE — Telephone Encounter (Signed)
Mrs. Loa calling to inquire about her albuterol rx.  Need to have sent to pharmacy asap.

## 2011-10-18 MED ORDER — ALBUTEROL SULFATE HFA 108 (90 BASE) MCG/ACT IN AERS: 1.0000 | INHALATION_SPRAY | Freq: Four times a day (QID) | RESPIRATORY_TRACT | Status: DC | PRN

## 2011-10-24 ENCOUNTER — Other Ambulatory Visit: Payer: 59

## 2011-10-24 DIAGNOSIS — R5383 Other fatigue: Secondary | ICD-10-CM

## 2011-10-24 DIAGNOSIS — Z0000 Encounter for general adult medical examination without abnormal findings: Secondary | ICD-10-CM

## 2011-10-24 LAB — BASIC METABOLIC PANEL
BUN: 7 mg/dL (ref 6–23)
Calcium: 9.2 mg/dL (ref 8.4–10.5)
Creat: 0.79 mg/dL (ref 0.50–1.10)
Potassium: 3.9 mEq/L (ref 3.5–5.3)

## 2011-10-24 LAB — LIPID PANEL
Cholesterol: 149 mg/dL (ref 0–200)
Triglycerides: 247 mg/dL — ABNORMAL HIGH (ref <150)
VLDL: 49 mg/dL — ABNORMAL HIGH (ref 0–40)

## 2011-10-24 NOTE — Progress Notes (Signed)
BMP,TSH AND FLP DONE TODAY Barbara Barrera 

## 2011-10-28 ENCOUNTER — Encounter: Payer: Self-pay | Admitting: Sports Medicine

## 2011-10-28 DIAGNOSIS — R0602 Shortness of breath: Secondary | ICD-10-CM | POA: Insufficient documentation

## 2011-10-28 DIAGNOSIS — Z0000 Encounter for general adult medical examination without abnormal findings: Secondary | ICD-10-CM | POA: Insufficient documentation

## 2011-10-28 NOTE — Assessment & Plan Note (Signed)
Requesting refill on Albuterol.   Encouraged to quit smoking >>>Discuss use at next visit.  Will likely need controller meds.

## 2011-10-28 NOTE — Assessment & Plan Note (Signed)
Fasting Lipids and BMET  ordered

## 2011-10-28 NOTE — Assessment & Plan Note (Signed)
Reports long standing.  Will check TSH

## 2011-10-28 NOTE — Assessment & Plan Note (Addendum)
Preparation Phase.  Requesting medications.  Feel that Wellbutrin is a good option given her concominant depression

## 2011-10-28 NOTE — Assessment & Plan Note (Signed)
Suggested OTC compression stockings for symptomatic relief.  If pt tolerates and wishes Rx grade would consider [facility]

## 2011-10-30 ENCOUNTER — Ambulatory Visit: Payer: 59 | Admitting: Sports Medicine

## 2011-11-01 ENCOUNTER — Encounter: Payer: Self-pay | Admitting: Sports Medicine

## 2012-02-22 ENCOUNTER — Telehealth: Payer: Self-pay | Admitting: Sports Medicine

## 2012-02-22 NOTE — Telephone Encounter (Signed)
Patient is calling because she needs a referral for a Mammogram, she called [facility] herself but was told that she would need a referral from her PCP.

## 2012-03-03 ENCOUNTER — Telehealth: Payer: Self-pay | Admitting: Psychology

## 2012-03-03 ENCOUNTER — Ambulatory Visit (INDEPENDENT_AMBULATORY_CARE_PROVIDER_SITE_OTHER): Payer: 59 | Admitting: Sports Medicine

## 2012-03-03 VITALS — BP 130/80 | HR 113 | Temp 99.5°F | Ht 60.0 in | Wt 197.0 lb

## 2012-03-03 DIAGNOSIS — N649 Disorder of breast, unspecified: Secondary | ICD-10-CM

## 2012-03-03 DIAGNOSIS — F329 Major depressive disorder, single episode, unspecified: Secondary | ICD-10-CM

## 2012-03-03 DIAGNOSIS — F17200 Nicotine dependence, unspecified, uncomplicated: Secondary | ICD-10-CM

## 2012-03-03 DIAGNOSIS — G4726 Circadian rhythm sleep disorder, shift work type: Secondary | ICD-10-CM

## 2012-03-03 MED ORDER — PAROXETINE HCL 20 MG PO TABS: 20.0000 mg | ORAL_TABLET | ORAL | Status: DC

## 2012-03-03 NOTE — Assessment & Plan Note (Signed)
Interested in Smoking cessation but still in preparation phase Given 1800 quit now info

## 2012-03-03 NOTE — Assessment & Plan Note (Signed)
Pt with strong family hx of early breast cancer. Felt "lump" @ 2o'clock position of breast over the past 6 months but not appreciable on exam; feels like normal breast tissue.  Small 0.5cm skin lesion @ 2o'clock position on areola.  >Refer for diagnostic mammogram

## 2012-03-03 NOTE — Assessment & Plan Note (Signed)
Likely contributing to mood disorder and visa-versa Try Melatonin

## 2012-03-03 NOTE — Patient Instructions (Signed)
It was nice to see you today.   Today we discussed: 1. DEPRESSION I have sent in a prescription for Paxil.  Please stop your Wellbutrin. I recommend that you meet with our psychologist, Dr. Spero Geralds, for help dealing with your depression.  You can schedule an appointment with her by calling her directly at [phone].  2. Breast lesion [facility] - Ambulatory referral to [facility]  3. Sleep disorder, shift work Please consider starting Melatonin 10mg  prior to your desired sleep time.  Sleep is an integral part of our bodies ability to recover from our daily activities and is key to making you body perform at its maximal potential.  Establishing and maintaining a healthy sleep pattern can not only make you feel better it can help many chronic illnesses including high blood pressure, high cholesterol, obesity, chronic pain syndromes, and many others.  Some key things to remember regarding sleep are:  Establish a consistent nightly routine that you do each night before bed.  Avoid caffeine, tobacco and alcohol as all of these drugs will cause sleep disturbances.    Establish an exercise routine.  This can be as simple as walking, dancing or what every you find gets your heart rate elevated to the point you can speak in only 3-4 word sentences.  Exercise will help make falling and staying asleep easier.    If you have difficulty with sleep, reserve the bedroom for sleeping; do not watch TV, read, eat or exercise in your bed room.      - Watching TV in bed can trick your brain into thinking it is day time and will reset your internal clock.  If you are going to watch TV before bed do so outside of the bedroom.  Although it is best to avoid screens for the hour prior to bed that is difficult to do in our current technology driven society - at the very least it is imperative that you remove TV from the bed room.      - If you have a hard time falling  asleep avoid showering and exercising prior to bed.     4. SMOKER  I'm glad you are thinking about quitting! Consider using 1800-Quit-Now as a resource to help you quit!   Please plan to return to see me in 2-4 weeks.  If you need anything prior to seeing me please call the clinic.  Please Bring all medications with you to each appointment.

## 2012-03-03 NOTE — Assessment & Plan Note (Addendum)
Stop Wellbutrin  Start Paxil @ 20mg .  Previously taken, tolerated well Refer to Dr. Pascal Lux for counseling

## 2012-03-03 NOTE — Telephone Encounter (Signed)
Patient called to schedule a beh-med appointment.  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  Looking to make afternoon appointments or on her days off.  Scheduled for:  February 20th at 2:00.  I told her the following: -  If she is unable to make the appointment she needs to call me. -  If she misses the appointment without a phone call, I won't be able to schedule her back in my clinic. She voiced an understanding and was able to repeat the appointment date and time back to me.

## 2012-03-04 ENCOUNTER — Other Ambulatory Visit: Payer: Self-pay | Admitting: Sports Medicine

## 2012-03-04 DIAGNOSIS — N649 Disorder of breast, unspecified: Secondary | ICD-10-CM

## 2012-03-06 NOTE — Progress Notes (Signed)
  [facility]  Patient name: Barbara Barrera MRN [id number]  Date of birth: 1975/05/12  CC & HPI:  Barbara Barrera is a 37 y.o. female presenting today for follow up of:  # Depression & Smoking: reports feeling overwhelmed still.  Was taking Wellbutrin and not noting any difference.  Did not help with smoking. Has tried Paxil in past.  Would like to have counseling.   No SI/HI.  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  Difficult with sleep onset and maintenance once home.  No nightly routine.  ------------------------------------------------------------------------------------------------------------------ Medication Compliance: compliant all of the time  ------------------------------------------------------------------------------------------------------------------ New Concerns:  # Breast Mass:  Pt reports 2 aunts who died in 30s from breast Cancer.  Has lump she noticed ~6 months ago at [facility] position.  Does not change, is not growing,  Notices small skin lesion on areola   ROS:  PER HPI  Pertinent History Reviewed:  Medical & Surgical Hx:  Reviewed: Significant for fatiuge, headaches, and obesity Medications: Reviewed & Updated - See associated section in EMR Social History: Reviewed - current everyday smoker; ~1ppd   Objective Findings:  Vitals: BP 130/80  Pulse 113  Temp(Src) 99.5 F (37.5 C) (Oral)  Ht 5' (1.524 m)  Wt 197 lb (89.359 kg)  BMI 38.47 kg/m2  LMP 02/22/2012  PE: GENERAL:  Adult obese AA  female. In no discomfort; no respiratory distress. PSYCH: Alert and appropriately interactive; Insight:Good,  No SI/HI, noted, slightly flattened affect but appropriate mood Breast: large dense breasts without appreciable nodule, 2oclock position of breast seems to have normal breast tissue.  2 o'clock position of areola has small scaling skin lesion without erythema or exudate.  Diffusely tender.       Assessment & Plan:

## 2012-03-08 ENCOUNTER — Other Ambulatory Visit: Payer: Self-pay

## 2012-03-13 ENCOUNTER — Ambulatory Visit (INDEPENDENT_AMBULATORY_CARE_PROVIDER_SITE_OTHER): Payer: 59 | Admitting: Psychology

## 2012-03-13 DIAGNOSIS — F3289 Other specified depressive episodes: Secondary | ICD-10-CM
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
## 2012-03-14 ENCOUNTER — Ambulatory Visit
Admission: RE | Admit: 2012-03-14 | Discharge: 2012-03-14 | Disposition: A | Discharge status: Home / Self Care | Payer: 59 | Source: Ambulatory Visit | Attending: Family Medicine | Admitting: Family Medicine

## 2012-03-14 ENCOUNTER — Ambulatory Visit: Admission: RE | Admit: 2012-03-14 | Payer: 59 | Source: Ambulatory Visit
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
## 2012-03-18 ENCOUNTER — Ambulatory Visit (INDEPENDENT_AMBULATORY_CARE_PROVIDER_SITE_OTHER): Payer: 59 | Admitting: Sports Medicine

## 2012-03-18 ENCOUNTER — Encounter: Payer: Self-pay | Admitting: Sports Medicine

## 2012-03-18 VITALS — BP 136/80 | HR 90 | Temp 99.2°F | Ht 60.0 in | Wt 198.0 lb

## 2012-03-23 NOTE — Assessment & Plan Note (Signed)
Improving skin lesion.  Reassuring Mammogram. Routine Screening

## 2012-03-23 NOTE — Progress Notes (Signed)
  [facility]  Patient name: Barbara Barrera MRN [id number]  Date of birth: September 08, 1975  CC & HPI:  Barbara Barrera is a 37 y.o. female presenting today for follow up of:  # Depression: doing well on Paxil.  Seen by Dr. Pascal Lux.  No new concerns.  # Sleep Disorder: Trying melatonin, no concerns, no adverse effects.  Improved sleep quality and onset but still issues with latency  # Smoker:  Cutting back but still smoking.  Trying to be totally quit at work   # breast Lesion:  Skin lesion is healing with 1% hydrocortisone.  Reassuring mammogram ------------------------------------------------------------------------------------------------------------------ Medication Compliance: compliant most of the time  Diet Compliance: compliant most of the time ------------------------------------------------------------------------------------------------------------------ New Concerns:  Concerned about BP:  no orthostasis  no chest pain, no dyspnea on exertion, no orthopnea/PND, no peripheral edema,   no episodes of unilateral weakness, dysarthria or acute visual changes   ROS:  PER HPI  Pertinent History Reviewed:  Medical & Surgical Hx:  Reviewed: Significant for asthma well controlled Medications: Reviewed & Updated - See associated section in EMR Social History: Reviewed -  reports that she has been smoking.  She does not have any smokeless tobacco history on file.   Objective Findings:  Vitals: BP 136/80  Pulse 90  Temp(Src) 99.2 F (37.3 C) (Oral)  Ht 5' (1.524 m)  Wt 198 lb (89.812 kg)  BMI 38.67 kg/m2  LMP 02/22/2012  PE: GENERAL:  Adult obese AA  female. In no discomfort; no respiratory distress. PSYCH: Alert and appropriately interactive; Insight:Good No evidence of SI/HI  H&N: AT/[place], trachea midline EENT:  MMM, no scleral icterus, EOMi HEART: RRR, S1/S2 heard, no murmur LUNGS: CTA B, no wheezes, no crackles EXTREMITIES: Moves all 4 extremities  spontaneously, warm well perfused, no edema, bilateral DP and PT pulses 2/4.      Assessment & Plan:

## 2012-03-23 NOTE — Assessment & Plan Note (Signed)
Improved on Paxil, continue with Dr. Pascal Lux > consider increased dose > repeat PHQ-9 for tracking

## 2012-03-23 NOTE — Assessment & Plan Note (Signed)
Preparation > Action phase

## 2012-03-23 NOTE — Assessment & Plan Note (Signed)
Continue melatonin.

## 2012-03-27 ENCOUNTER — Telehealth: Payer: Self-pay | Admitting: Psychology

## 2012-03-27 NOTE — Telephone Encounter (Signed)
Patient left VM this morning canceling her appointment secondary to being called into work last night.  She needs to sleep today.  She said she would call to reschedule.

## 2012-05-09 IMAGING — CR DG SACRUM/COCCYX 2+V
3 series · 3 of 3 positions shown · non-contrast
Comparison: None.

CLINICAL DATA: Fall 06/05/2009.  Tailbone pain.

SACRUM AND COCCYX - 2+ VIEW

[t sacrum a.p.]
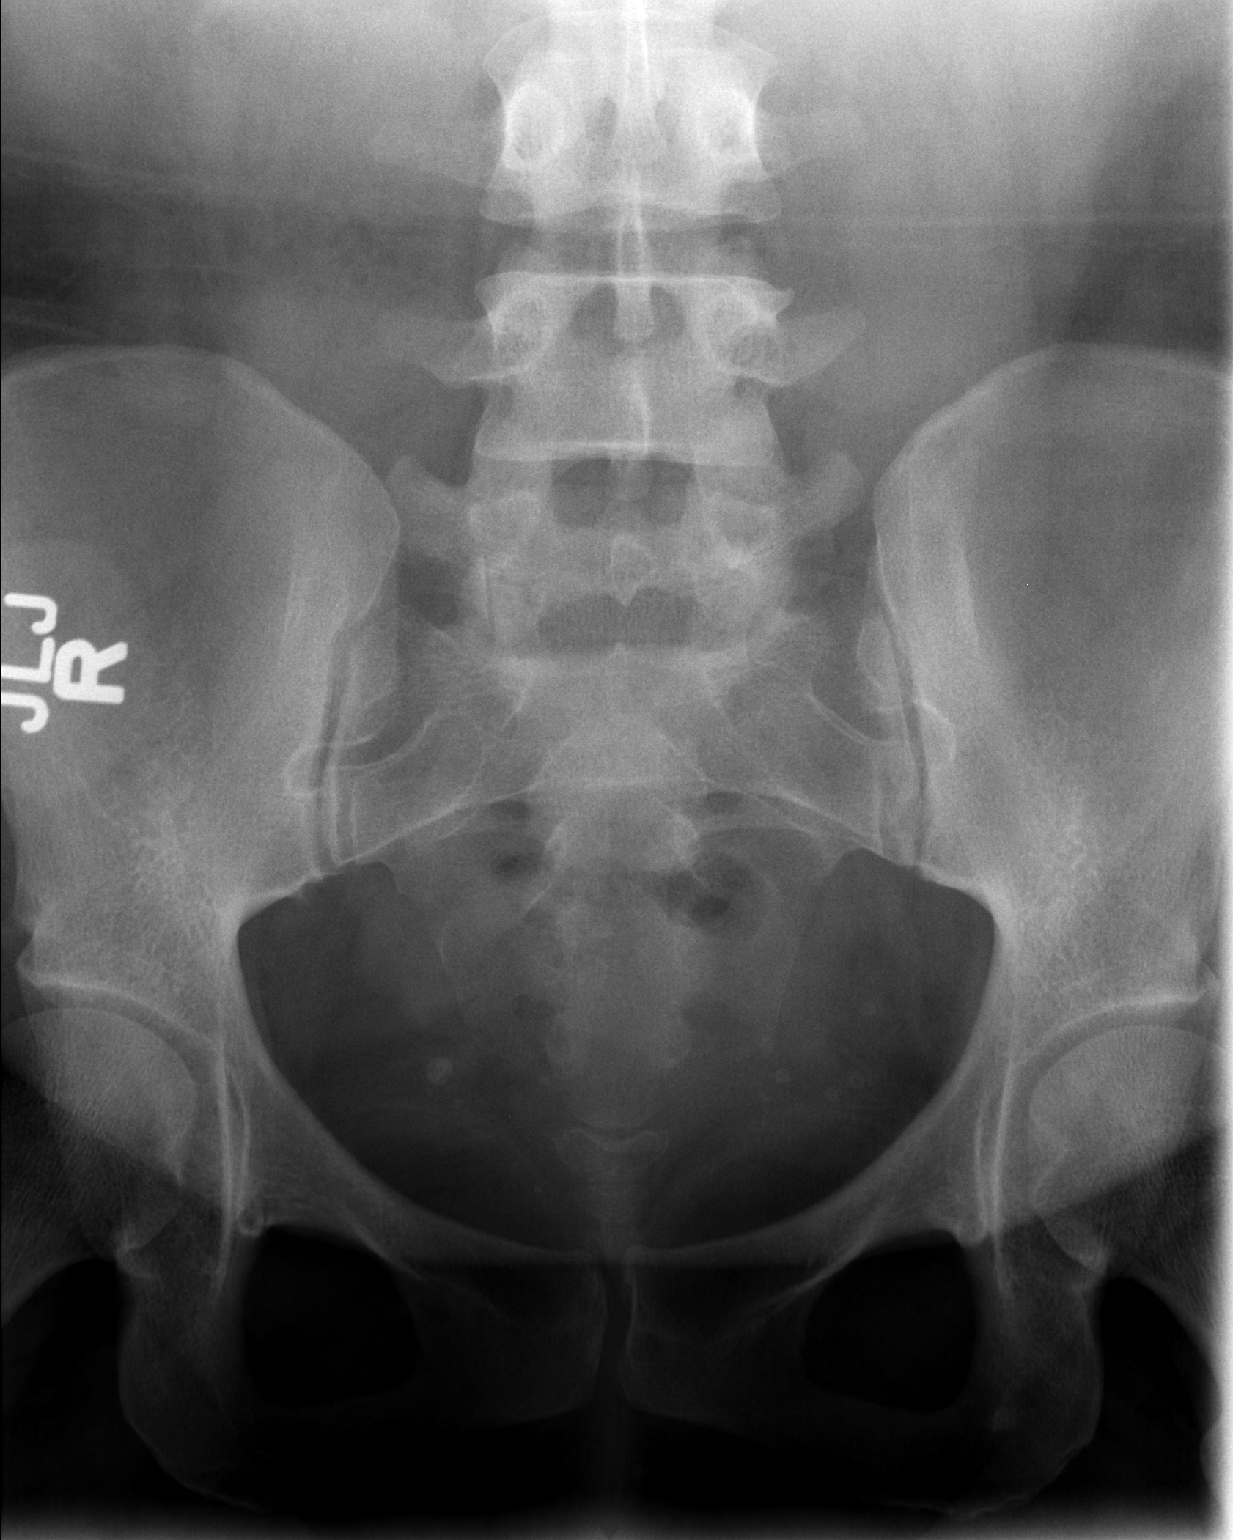

[t coccyx a.p. *]
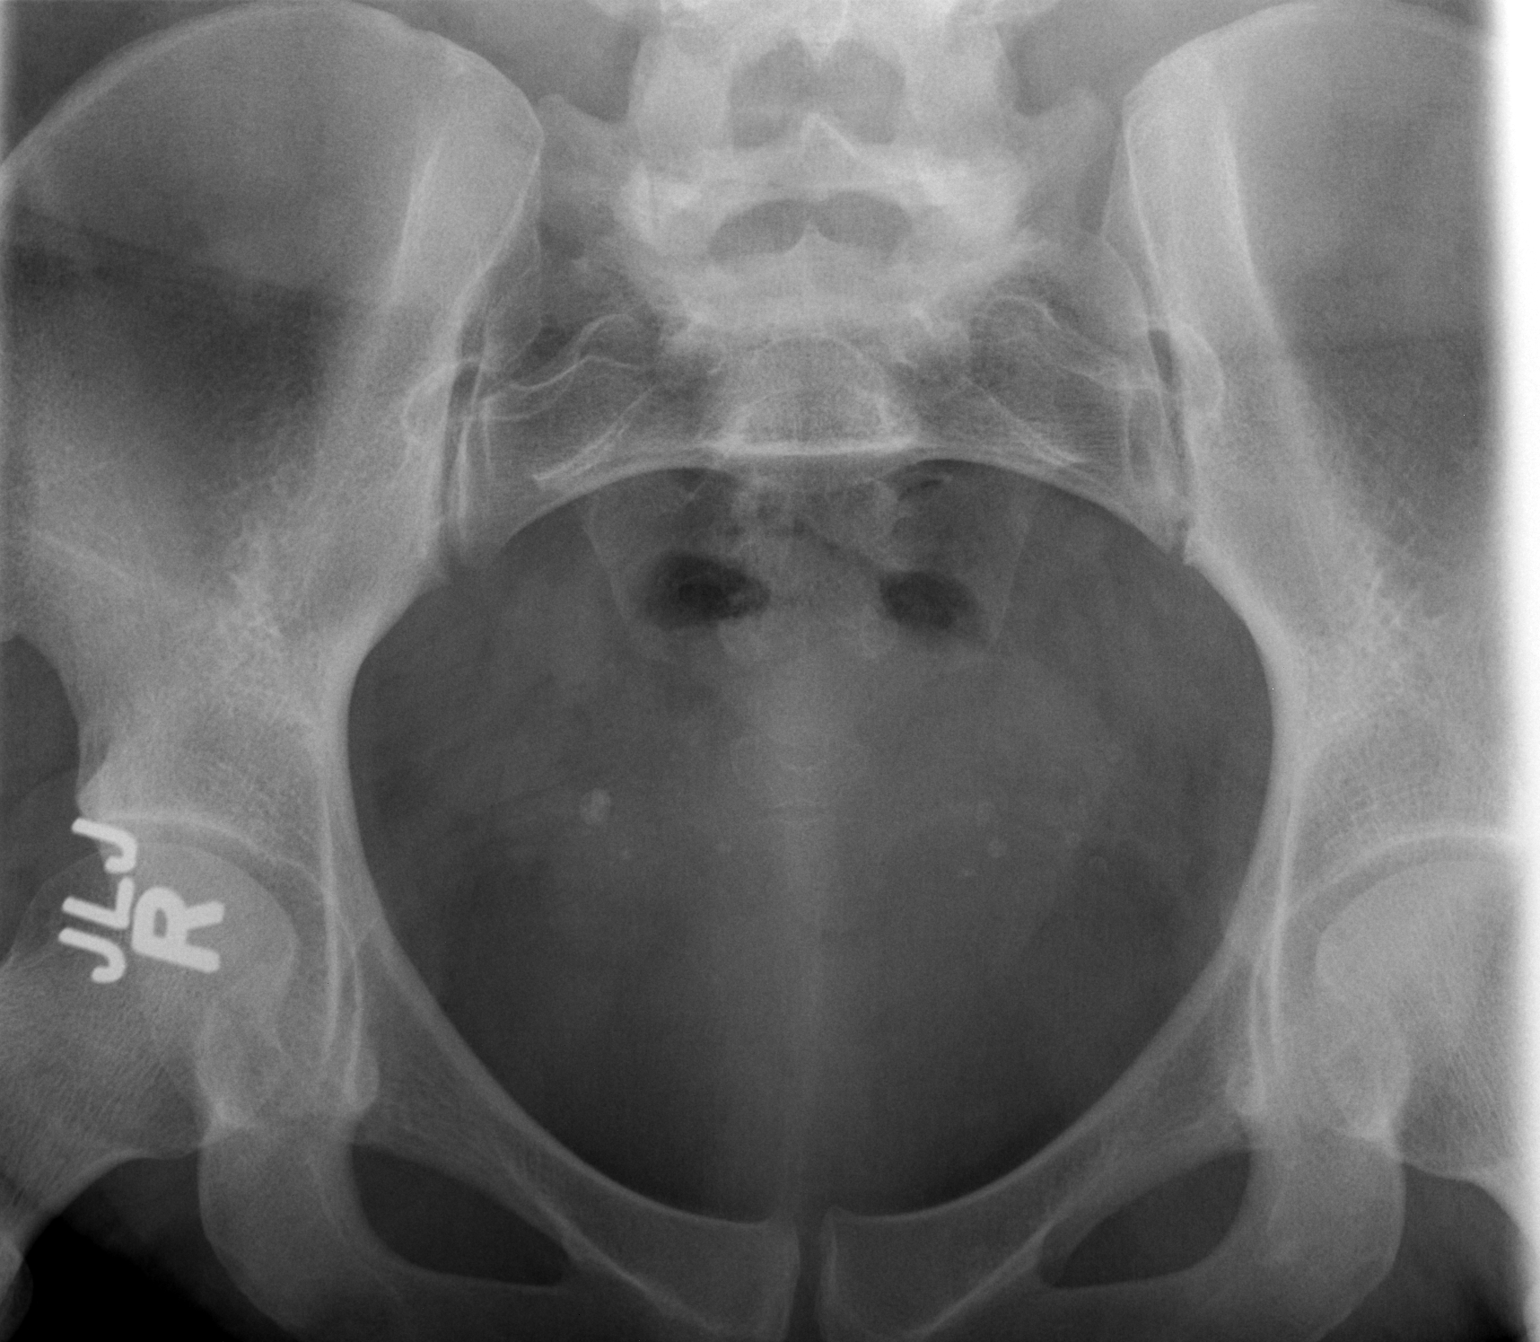

[t sacrum lat]
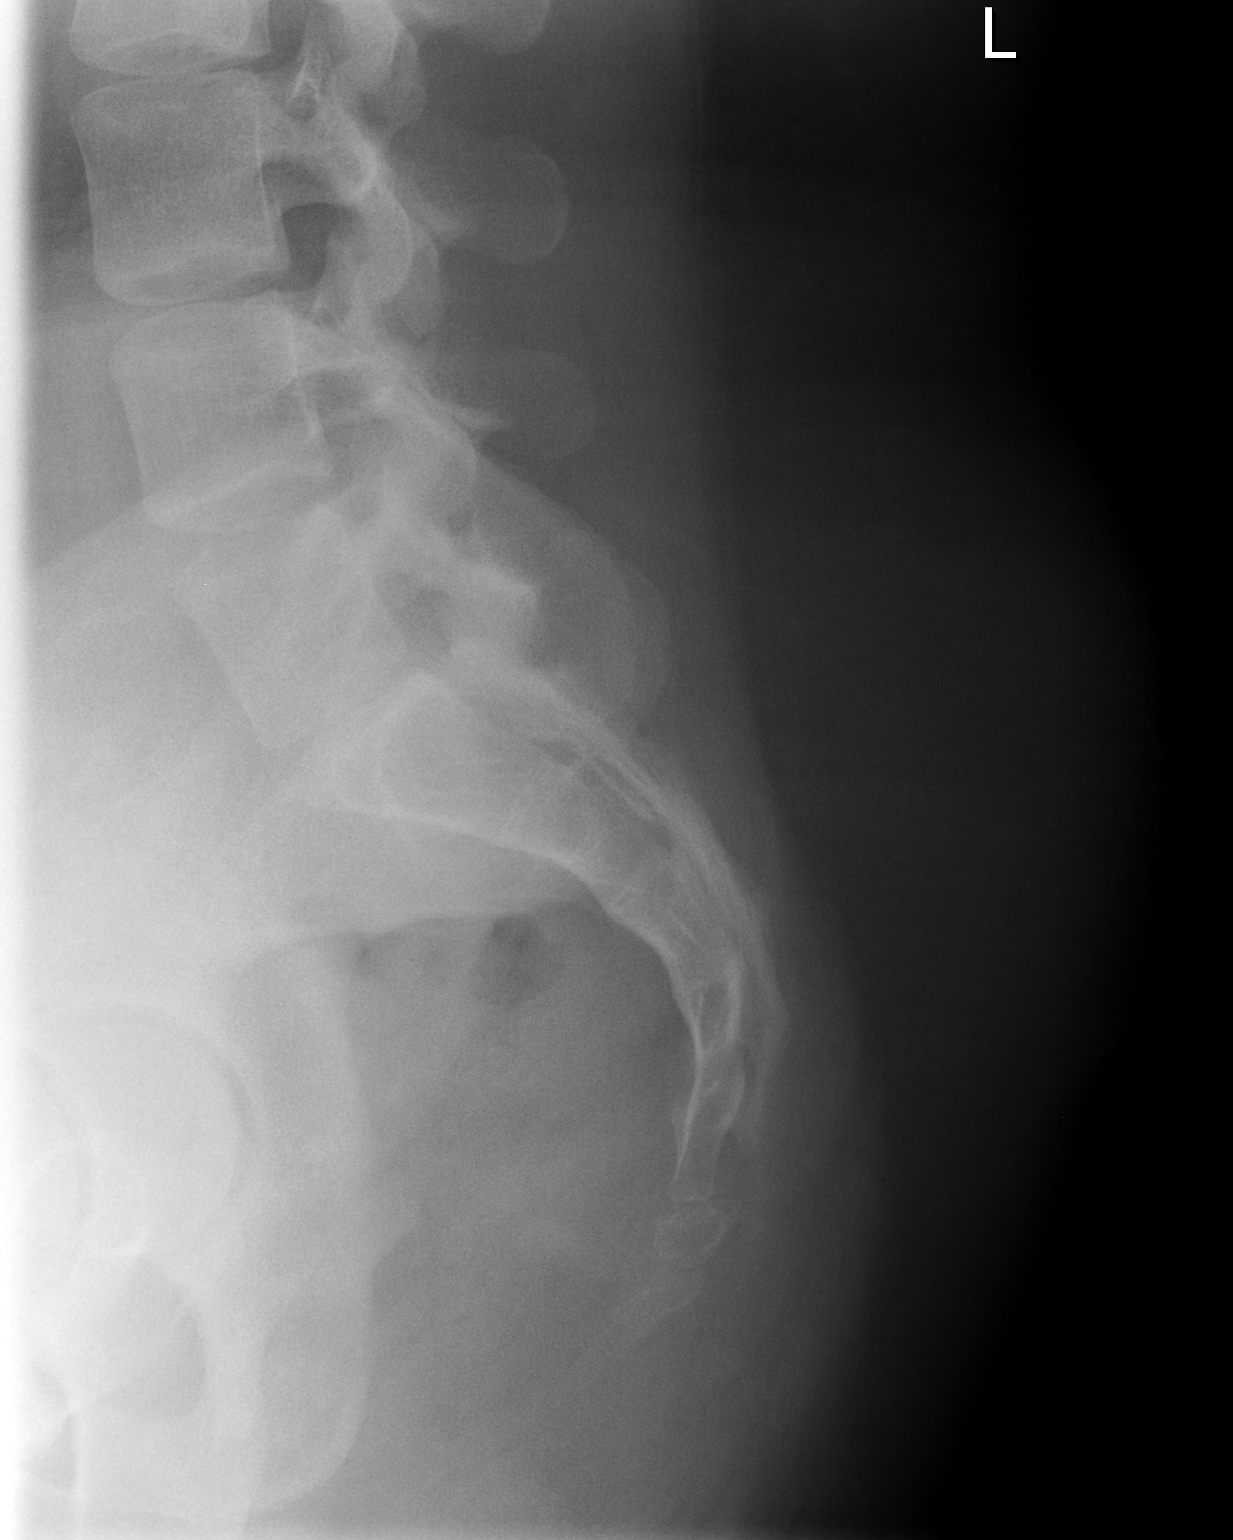

[3 of 3 positions shown; findings below may reference images not displayed]

FINDINGS: There is no fracture or bony displacement.  SI joints
unremarkable.
IMPRESSION: Negative sacrum and coccyx.

## 2012-08-19 ENCOUNTER — Ambulatory Visit: Payer: 59 | Admitting: Family Medicine

## 2012-11-27 ENCOUNTER — Other Ambulatory Visit: Payer: Self-pay

## 2013-03-02 ENCOUNTER — Ambulatory Visit (HOSPITAL_COMMUNITY)
Admission: RE | Admit: 2013-03-02 | Discharge: 2013-03-02 | Disposition: A | Discharge status: Home / Self Care | Payer: 59 | Source: Ambulatory Visit | Attending: Family Medicine | Admitting: Family Medicine

## 2013-03-02 ENCOUNTER — Ambulatory Visit (INDEPENDENT_AMBULATORY_CARE_PROVIDER_SITE_OTHER): Payer: 59 | Admitting: Family Medicine

## 2013-03-02 ENCOUNTER — Encounter: Payer: Self-pay | Admitting: Family Medicine

## 2013-03-02 VITALS — BP 125/84 | HR 96 | Temp 98.3°F | Resp 16 | Wt 198.0 lb

## 2013-03-02 DIAGNOSIS — R9431 Abnormal electrocardiogram [ECG] [EKG]: Secondary | ICD-10-CM | POA: Insufficient documentation

## 2013-03-02 DIAGNOSIS — Z862 Personal history of diseases of the blood and blood-forming organs and certain disorders involving the immune mechanism: Secondary | ICD-10-CM

## 2013-03-02 DIAGNOSIS — D649 Anemia, unspecified: Secondary | ICD-10-CM | POA: Insufficient documentation

## 2013-03-02 DIAGNOSIS — R55 Syncope and collapse: Secondary | ICD-10-CM

## 2013-03-02 LAB — POCT URINALYSIS DIPSTICK
Bilirubin, UA: NEGATIVE
Glucose, UA: NEGATIVE
Ketones, UA: NEGATIVE
Leukocytes, UA: NEGATIVE
Nitrite, UA: NEGATIVE
PROTEIN UA: NEGATIVE
Spec Grav, UA: >=1.03
UROBILINOGEN UA: 0.2
pH, UA: 5.5

## 2013-03-02 LAB — ANEMIA PANEL 7
%SAT: 2 % — AB (ref 20–55)
ABS Retic: 95.3 10*3/uL (ref 19.0–186.0)
FERRITIN: 6 ng/mL — AB (ref 10–291)
FOLATE: 6.6 ng/mL
HCT: 30.6 % — ABNORMAL LOW (ref 36.0–46.0)
Hemoglobin: 9.1 g/dL — ABNORMAL LOW (ref 12.0–15.0)
Iron: 13 ug/dL — ABNORMAL LOW (ref 42–145)
MCH: 20 pg — AB (ref 26.0–34.0)
MCHC: 29.7 g/dL — AB (ref 30.0–36.0)
MCV: 67.4 fL — AB (ref 78.0–100.0)
PLATELETS: 419 10*3/uL — AB (ref 150–400)
RBC.: 4.54 MIL/uL (ref 3.87–5.11)
RBC: 4.54 MIL/uL (ref 3.87–5.11)
RDW: 18.5 % — AB (ref 11.5–15.5)
Retic Ct Pct: 2.1 % (ref 0.4–2.3)
TIBC: 560 ug/dL — ABNORMAL HIGH (ref 250–470)
UIBC: 547 ug/dL — ABNORMAL HIGH (ref 125–400)
VITAMIN B 12: 674 pg/mL (ref 211–911)
WBC: 7.6 10*3/uL (ref 4.0–10.5)

## 2013-03-02 LAB — COMPREHENSIVE METABOLIC PANEL
ALK PHOS: 87 U/L (ref 39–117)
ALT: 19 U/L (ref 0–35)
AST: 17 U/L (ref 0–37)
Albumin: 3.8 g/dL (ref 3.5–5.2)
BILIRUBIN TOTAL: 0.2 mg/dL (ref 0.2–1.2)
BUN: 8 mg/dL (ref 6–23)
CO2: 23 mEq/L (ref 19–32)
Calcium: 9 mg/dL (ref 8.4–10.5)
Chloride: 105 mEq/L (ref 96–112)
Creat: 0.64 mg/dL (ref 0.50–1.10)
Glucose, Bld: 84 mg/dL (ref 70–99)
Potassium: 4.2 mEq/L (ref 3.5–5.3)
SODIUM: 138 meq/L (ref 135–145)
TOTAL PROTEIN: 6.8 g/dL (ref 6.0–8.3)

## 2013-03-02 LAB — POCT HEMOGLOBIN: HEMOGLOBIN: 8.7 g/dL — AB (ref 12.2–16.2)

## 2013-03-02 LAB — LACTATE DEHYDROGENASE: LDH: 169 U/L (ref 94–250)

## 2013-03-02 LAB — POCT UA - MICROSCOPIC ONLY

## 2013-03-02 LAB — TSH: TSH: 2.009 u[IU]/mL (ref 0.350–4.500)

## 2013-03-02 LAB — HAPTOGLOBIN: Haptoglobin: 218 mg/dL — ABNORMAL HIGH (ref 45–215)

## 2013-03-02 NOTE — Patient Instructions (Signed)
Thank you for coming in,   Right now we think your symptoms are related to your anemia. We will get the labs today and call you with the results tomorrow. In the meantime, make sure you have plenty to drink. Do not drive long distances or do not drive if you are feeling faint.   I'll call you tomorrow with the treatment plan.    Please feel free to call with any questions or concerns at any time, at [phone]. --Dr. Raeford Razor

## 2013-03-02 NOTE — Assessment & Plan Note (Addendum)
Found to have a Hgb 8.7. Most likely to be the cause of her symptoms.  - EKG was NSR  - Anemia panel pending, LDH, Haptoglobin, CMP   - currently on her menses  - failed hearing screen on left side. Passed on right side.  - Will determine further treatment based on her lab results.  - Discussed with Dr. Mingo Amber

## 2013-03-02 NOTE — Progress Notes (Signed)
    Subjective:     Patient ID: Barbara Barrera, female   DOB: 1975-08-07, 38 y.o.   MRN: [id number]  HPI Barbara Barrera is here for dizziness.  She reports having dizziness since last Wednesday.  She had two episodes of syncope. These were un witnessed so the length is undermined.  One episode was in the shower and the other was waking into her house.  She feels like the room is spinning.  These episodes having been occuring everyday and last 8-10 seconds. They occurred every 15-20 minutes. There is no family history of similar episodes. This has never happened to her before. She has not started any new medications. She has not tried to quit smoking. She has not started any new diet or exercise. She hasn't had much of an appetite. She has never had any trauma to her head. Her dizziness is worse when she stands up. She only occasionally uses alcohol and doesn't use any illicit drugs.  She denies any chest pain or shortness of breath. 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  This was found during employee health screening. She usually wears a hearing aid but not during work.   Found to have a Hgb 8.7.  She is currently on her menses.  This is a decrease from previous studies.      Current Outpatient Prescriptions on File Prior to Visit  Medication Sig Dispense Refill  . albuterol (VENTOLIN HFA) 108 (90 BASE) MCG/ACT inhaler Inhale 1-2 puffs into the lungs every 6 (six) hours as needed for wheezing or shortness of breath. 1-2 puffs inhaled for shortness of breath  1 Inhaler  1  . acetaminophen (AMINOFEN) 500 MG tablet Take 500 mg by mouth every 4 (four) hours as needed. For headache       . PARoxetine (PAXIL) 20 MG tablet Take 1 tablet (20 mg total) by mouth every morning.  30 tablet  2   No current facility-administered medications on file prior to visit.    I have reviewed and updated the following as  appropriate: allergies, current medications, past family history, past medical history, past social history, past surgical history and problem list SHx:   Review of Systems All other systems reviewed and otherwise normal.      Objective:   Physical Exam BP 125/84  Pulse 96  Temp(Src) 98.3 F (36.8 C) (Oral)  Resp 16  Wt 198 lb (89.812 kg)  SpO2 100%  LMP 03/02/2013 Orthostatic VS:  Lying: 135/85 HR 79 Sitting: 139/87 HR 87 Standing: 125/84 HR 96  Gen: NAD, alert, cooperative with exam, well-appearing, African American female  CV: RRR, good S1/S2, no murmur, no edema, capillary refill brisk  Resp: CTABL, no wheezes, non-labored Skin: no rashes, normal turgor  Neuro: no gross deficits, CN 2-12 intact, normal sensation in upper and lower extremities, 5/5 strength in upper and lower extremities      Assessment:         Plan:

## 2013-03-03 ENCOUNTER — Telehealth: Payer: Self-pay | Admitting: Family Medicine

## 2013-03-03 DIAGNOSIS — D509 Iron deficiency anemia, unspecified: Secondary | ICD-10-CM

## 2013-03-03 MED ORDER — FERROUS SULFATE 325 (65 FE) MG PO TABS: 325.0000 mg | ORAL_TABLET | Freq: Two times a day (BID) | ORAL | Status: DC

## 2013-03-03 NOTE — Telephone Encounter (Signed)
Patient with Hgb 8.7 yesterday.  She was symptomatic. Labs indicating that she is iron deficient. She is currently on her menses. Will start Ferrous sulfate 325 mg BID. Will need to follow up CBC and if improving or still symptomatic.

## 2013-03-03 NOTE — Telephone Encounter (Signed)
Relayed message,patient voiced understanding. Ajeenah Heiny S  

## 2013-03-08 ENCOUNTER — Emergency Department (HOSPITAL_COMMUNITY)
Admission: EM | Admit: 2013-03-08 | Discharge: 2013-03-08 | Disposition: A | Discharge status: Home / Self Care | Payer: 59 | Attending: Emergency Medicine | Admitting: Emergency Medicine

## 2013-03-08 ENCOUNTER — Encounter (HOSPITAL_COMMUNITY): Payer: Self-pay | Admitting: Emergency Medicine

## 2013-03-08 ENCOUNTER — Emergency Department (HOSPITAL_COMMUNITY): Payer: 59

## 2013-03-08 DIAGNOSIS — S93609A Unspecified sprain of unspecified foot, initial encounter: Secondary | ICD-10-CM | POA: Insufficient documentation

## 2013-03-08 DIAGNOSIS — X500XXA Overexertion from strenuous movement or load, initial encounter: Secondary | ICD-10-CM | POA: Insufficient documentation

## 2013-03-08 DIAGNOSIS — Z862 Personal history of diseases of the blood and blood-forming organs and certain disorders involving the immune mechanism: Secondary | ICD-10-CM | POA: Insufficient documentation

## 2013-03-08 DIAGNOSIS — Z79899 Other long term (current) drug therapy: Secondary | ICD-10-CM | POA: Insufficient documentation

## 2013-03-08 DIAGNOSIS — F17200 Nicotine dependence, unspecified, uncomplicated: Secondary | ICD-10-CM | POA: Insufficient documentation

## 2013-03-08 DIAGNOSIS — Y939 Activity, unspecified: Secondary | ICD-10-CM | POA: Insufficient documentation

## 2013-03-08 DIAGNOSIS — S93602A Unspecified sprain of left foot, initial encounter: Secondary | ICD-10-CM

## 2013-03-08 DIAGNOSIS — R42 Dizziness and giddiness: Secondary | ICD-10-CM | POA: Insufficient documentation

## 2013-03-08 DIAGNOSIS — W108XXA Fall (on) (from) other stairs and steps, initial encounter: Secondary | ICD-10-CM | POA: Insufficient documentation

## 2013-03-08 DIAGNOSIS — Y929 Unspecified place or not applicable: Secondary | ICD-10-CM | POA: Insufficient documentation

## 2013-03-08 HISTORY — DX: Anemia, unspecified: D64.9

## 2013-03-08 MED ORDER — HYDROCODONE-ACETAMINOPHEN 5-325 MG PO TABS: 1.0000 | ORAL_TABLET | ORAL | Status: DC | PRN

## 2013-03-08 MED ORDER — IBUPROFEN 400 MG PO TABS
800.0000 mg | ORAL_TABLET | Freq: Once | ORAL | Status: AC
  Administered 2013-03-08: 800 mg via ORAL
  Filled 2013-03-08: qty 2

## 2013-03-08 NOTE — Discharge Instructions (Signed)
Foot Sprain The muscles and cord like structures which attach muscle to bone (tendons) that surround the feet are made up of units. A foot sprain can occur at the weakest spot in any of these units. This condition is most often caused by injury to or overuse of the foot, as from playing contact sports, or aggravating a previous injury, or from poor conditioning, or obesity. SYMPTOMS  Pain with movement of the foot.  Tenderness and swelling at the injury site.  Loss of strength is present in moderate or severe sprains. THE THREE GRADES OR SEVERITY OF FOOT SPRAIN ARE:  Mild (Grade I): Slightly pulled muscle without tearing of muscle or tendon fibers or loss of strength.  Moderate (Grade II): Tearing of fibers in a muscle, tendon, or at the attachment to bone, with small decrease in strength.  Severe (Grade III): Rupture of the muscle-tendon-bone attachment, with separation of fibers. Severe sprain requires surgical repair. Often repeating (chronic) sprains are caused by overuse. Sudden (acute) sprains are caused by direct injury or over-use. DIAGNOSIS  Diagnosis of this condition is usually by your own observation. If problems continue, a caregiver may be required for further evaluation and treatment. X-rays may be required to make sure there are not breaks in the bones (fractures) present. Continued problems may require physical therapy for treatment. PREVENTION  Use strength and conditioning exercises appropriate for your sport.  Warm up properly prior to working out.  Use athletic shoes that are made for the sport you are participating in.  Allow adequate time for healing. Early return to activities makes repeat injury more likely, and can lead to an unstable arthritic foot that can result in prolonged disability. Mild sprains generally heal in 3 to 10 days, with moderate and severe sprains taking 2 to 10 weeks. Your caregiver can help you determine the proper time required for  healing. HOME CARE INSTRUCTIONS   Apply ice to the injury for 15-20 minutes, 03-04 times per day. Put the ice in a plastic bag and place a towel between the bag of ice and your skin.  An elastic wrap (like an Ace bandage) may be used to keep swelling down.  Keep foot above the level of the heart, or at least raised on a footstool, when swelling and pain are present.  Try to avoid use other than gentle range of motion while the foot is painful. Do not resume use until instructed by your caregiver. Then begin use gradually, not increasing use to the point of pain. If pain does develop, decrease use and continue the above measures, gradually increasing activities that do not cause discomfort, until you gradually achieve normal use.  Use crutches if and as instructed, and for the length of time instructed.  Keep injured foot and ankle wrapped between treatments.  Massage foot and ankle for comfort and to keep swelling down. Massage from the toes up towards the knee.  Only take over-the-counter or prescription medicines for pain, discomfort, or fever as directed by your caregiver. SEEK IMMEDIATE MEDICAL CARE IF:   Your pain and swelling increase, or pain is not controlled with medications.  You have loss of feeling in your foot or your foot turns cold or blue.  You develop new, unexplained symptoms, or an increase of the symptoms that brought you to your caregiver. MAKE SURE YOU:   Understand these instructions.  Will watch your condition.  Will get help right away if you are not doing well or get worse. Document Released:  06/30/2001 Document Revised: 04/02/2011 Document Reviewed: 08/28/2007 [facility] Patient Information 2014 [place].  Muscle Strain A muscle strain is an injury that occurs when a muscle is stretched beyond its normal length. Usually a small number of muscle fibers are torn when this happens. Muscle strain is rated in degrees. First-degree strains have the least  amount of muscle fiber tearing and pain. Second-degree and third-degree strains have increasingly more tearing and pain.  Usually, recovery from muscle strain takes 1 2 weeks. Complete healing takes 5 6 weeks.  CAUSES  Muscle strain happens when a sudden, violent force placed on a muscle stretches it too far. This may occur with lifting, sports, or a fall.  RISK FACTORS Muscle strain is especially common in athletes.  SIGNS AND SYMPTOMS At the site of the muscle strain, there may be:  Pain.  Bruising.  Swelling.  Difficulty using the muscle due to pain or lack of normal function. DIAGNOSIS  Your health care provider will perform a physical exam and ask about your medical history. TREATMENT  Often, the best treatment for a muscle strain is resting, icing, and applying cold compresses to the injured area.  HOME CARE INSTRUCTIONS   Use the PRICE method of treatment to promote muscle healing during the first 2 3 days after your injury. The PRICE method involves:  Protecting the muscle from being injured again.  Restricting your activity and resting the injured body part.  Icing your injury. To do this, put ice in a plastic bag. Place a towel between your skin and the bag. Then, apply the ice and leave it on from 15 20 minutes each hour. After the third day, switch to moist heat packs.  Apply compression to the injured area with a splint or elastic bandage. Be careful not to wrap it too tightly. This may interfere with blood circulation or increase swelling.  Elevate the injured body part above the level of your heart as often as you can.  Only take over-the-counter or prescription medicines for pain, discomfort, or fever as directed by your health care provider.  Warming up prior to exercise helps to prevent future muscle strains. SEEK MEDICAL CARE IF:   You have increasing pain or swelling in the injured area.  You have numbness, tingling, or a significant loss of strength in  the injured area. MAKE SURE YOU:   Understand these instructions.  Will watch your condition.  Will get help right away if you are not doing well or get worse. Document Released: 01/08/2005 Document Revised: 10/29/2012 Document Reviewed: 08/07/2012 [facility] Patient Information 2014 [place], [place].

## 2013-03-08 NOTE — ED Notes (Signed)
Ortho called 

## 2013-03-08 NOTE — ED Notes (Signed)
X ray called for pick up time.

## 2013-03-08 NOTE — Progress Notes (Signed)
Orthopedic Tech Progress Note Patient Details:  SHUNTA MCLAURIN 1975/02/01 [id number] Ankle ASO applied to Left LE. Application tolerated well. Care instructions explained. Ortho Devices Type of Ortho Device: ASO Ortho Device/Splint Location: Left LE Ortho Device/Splint Interventions: Application   Asia R Thompson 03/08/2013, 10:19 AM

## 2013-03-08 NOTE — ED Notes (Signed)
Pt c/o left foot pain onset Thursday after falling down the stairs. Pt ambulatory with a limp in triage.

## 2013-03-08 NOTE — ED Provider Notes (Signed)
CSN: [id number]     Arrival date & time 03/08/13  0721 History   First MD Initiated Contact with Patient 03/08/13 0801     Chief Complaint  Patient presents with  . Foot Pain    left foot     (Consider location/radiation/quality/duration/timing/severity/associated sxs/prior Treatment) HPI Comments: Patient is 38 year old female with history of anemia who states that because of this she has been feeling dizzy lately.  She states she had an episode of dizziness this past Thursday causing her to loose her balance and twist her left foot - she states she has been ambulatory on the foot but has noticed increase in pain and swelling since working 12 hour shift last night.  She denies any numbness, tingling, prior injury or surgery.  Patient is a 38 y.o. female presenting with lower extremity pain. The history is provided by the patient. No language interpreter was used.  Foot Pain This is a new problem. The current episode started in the past 7 days. The problem occurs constantly. The problem has been gradually worsening. Associated symptoms include arthralgias, joint swelling and myalgias. Pertinent negatives include no abdominal pain, chest pain, chills, coughing, fever, nausea, numbness, urinary symptoms, vomiting or weakness. The symptoms are aggravated by walking. She has tried nothing for the symptoms. The treatment provided no relief.    Past Medical History  Diagnosis Date  . Anemia    Past Surgical History  Procedure Laterality Date  . Cesarean section     No family history on file. History  Substance Use Topics  . Smoking status: Current Every Day Smoker  . Smokeless tobacco: Not on file  . Alcohol Use: Yes   OB History   Grav Para Term Preterm Abortions TAB SAB Ect Mult Living                 Review of Systems  Constitutional: Negative for fever and chills.  Respiratory: Negative for cough.   Cardiovascular: Negative for chest pain.  Gastrointestinal: Negative for  nausea, vomiting and abdominal pain.  Musculoskeletal: Positive for arthralgias, joint swelling and myalgias.  Neurological: Negative for weakness and numbness.  All other systems reviewed and are negative.      Allergies  Review of patient's allergies indicates no known allergies.  Home Medications   Current Outpatient Rx  Name  Route  Sig  Dispense  Refill  . albuterol (VENTOLIN HFA) 108 (90 BASE) MCG/ACT inhaler   Inhalation   Inhale 1-2 puffs into the lungs every 6 (six) hours as needed for wheezing or shortness of breath. 1-2 puffs inhaled for shortness of breath   1 Inhaler   1    BP 131/88  Pulse 93  Temp(Src) 98 F (36.7 C) (Oral)  Resp 18  Ht 5\' 1"  (3.710 m)  Wt 198 lb (89.812 kg)  BMI 37.43 kg/m2  SpO2 98%  LMP 03/02/2013 Physical Exam  Nursing note and vitals reviewed. Constitutional: She is oriented to person, place, and time. She appears well-developed and well-nourished. No distress.  HENT:  Head: Normocephalic and atraumatic.  Mouth/Throat: Oropharynx is clear and moist.  Eyes: Conjunctivae are normal. No scleral icterus.  Pulmonary/Chest: Effort normal.  Musculoskeletal: She exhibits edema and tenderness.       Left ankle: She exhibits swelling. She exhibits normal range of motion and normal pulse. Tenderness. Lateral malleolus tenderness found. No medial malleolus tenderness found.       Left foot: She exhibits tenderness and bony tenderness. She exhibits normal  range of motion.       Feet:  Neurological: She is alert and oriented to person, place, and time. She exhibits normal muscle tone. Coordination normal.  Skin: Skin is warm and dry. No rash noted. No erythema. No pallor.  Psychiatric: She has a normal mood and affect. Her behavior is normal. Judgment and thought content normal.    ED Course  Procedures (including critical care time) Labs Review Labs Reviewed - No data to display Imaging Review No results found.  EKG Interpretation    None      Results for orders placed in visit on 03/02/13  TSH      Result Value Ref Range   TSH 2.009  0.350 - 4.500 uIU/mL  ANEMIA PANEL 7      Result Value Ref Range   WBC 7.6  4.0 - 10.5 K/uL   RBC 4.54  3.87 - 5.11 MIL/uL   Hemoglobin 9.1 (*) 12.0 - 15.0 g/dL   HCT 23.5 (*) 57.3 - 22.0 %   MCV 67.4 (*) 78.0 - 100.0 fL   MCH 20.0 (*) 26.0 - 34.0 pg   MCHC 29.7 (*) 30.0 - 36.0 g/dL   RDW 25.4 (*) 27.0 - 62.3 %   Platelets 419 (*) 150 - 400 K/uL   Retic Ct Pct 2.1  0.4 - 2.3 %   RBC. 4.54  3.87 - 5.11 MIL/uL   ABS Retic 95.3  19.0 - 186.0 K/uL   Iron 13 (*) 42 - 145 ug/dL   UIBC 762 (*) 831 - 517 ug/dL   TIBC 616 (*) 073 - 710 ug/dL   %SAT 2 (*) 20 - 55 %   Vitamin B-12 674  211 - 911 pg/mL   Folate 6.6     Ferritin 6 (*) 10 - 291 ng/mL  LACTATE DEHYDROGENASE      Result Value Ref Range   LDH 169  94 - 250 U/L  HAPTOGLOBIN      Result Value Ref Range   Haptoglobin 218 (*) 45 - 215 mg/dL  COMPREHENSIVE METABOLIC PANEL      Result Value Ref Range   Sodium 138  135 - 145 mEq/L   Potassium 4.2  3.5 - 5.3 mEq/L   Chloride 105  96 - 112 mEq/L   CO2 23  19 - 32 mEq/L   Glucose, Bld 84  70 - 99 mg/dL   BUN 8  6 - 23 mg/dL   Creat 6.26  9.48 - 5.46 mg/dL   Total Bilirubin 0.2  0.2 - 1.2 mg/dL   Alkaline Phosphatase 87  39 - 117 U/L   AST 17  0 - 37 U/L   ALT 19  0 - 35 U/L   Total Protein 6.8  6.0 - 8.3 g/dL   Albumin 3.8  3.5 - 5.2 g/dL   Calcium 9.0  8.4 - 27.0 mg/dL  POCT HEMOGLOBIN      Result Value Ref Range   Hemoglobin 8.7 (*) 12.2 - 16.2 g/dL  POCT URINALYSIS DIPSTICK      Result Value Ref Range   Color, UA YELLOW     Clarity, UA CLEAR     Glucose, UA NEG     Bilirubin, UA NEG     Ketones, UA NEG     Spec Grav, UA >=1.030     Blood, UA SMALL     pH, UA 5.5     Protein, UA NEG     Urobilinogen, UA 0.2  Nitrite, UA NEG     Leukocytes, UA Negative    POCT UA - MICROSCOPIC ONLY      Result Value Ref Range   RBC, urine, microscopic 0-2      Bacteria, U Microscopic 1+     Epithelial cells, urine per micros 0-3     Dg Foot Complete Left  03/08/2013   CLINICAL DATA:  Fall with left foot pain.  EXAM: LEFT FOOT - COMPLETE 3+ VIEW  COMPARISON:  None.  FINDINGS: There is no evidence of fracture or dislocation. There is no evidence of arthropathy or other focal bone abnormality. Soft tissues are unremarkable.  IMPRESSION: Negative.   Electronically Signed   By: Aletta Edouard M.D.   On: 03/08/2013 09:33      MDM   Left ankle/foot sprain  Patient here with left foot and ankle pain after twisting injury with near syncopal episode, no evidence of fracture.  Plan to place in ASO, RICE, follow up with ortho if needed.    Idalia Needle Joelyn Oms, PA-C 03/08/13 1006

## 2013-03-08 NOTE — ED Notes (Signed)
Patient discharged to home. NAD.

## 2013-03-12 NOTE — ED Provider Notes (Signed)
Medical screening examination/treatment/procedure(s) were performed by non-physician practitioner and as supervising physician I was immediately available for consultation/collaboration.  EKG Interpretation   None         Tanna Furry, MD 03/12/13 [phone]

## 2013-04-13 ENCOUNTER — Other Ambulatory Visit (HOSPITAL_COMMUNITY)
Admission: RE | Admit: 2013-04-13 | Discharge: 2013-04-13 | Disposition: A | Discharge status: Home / Self Care | Payer: 59 | Source: Ambulatory Visit | Attending: Obstetrics & Gynecology | Admitting: Obstetrics & Gynecology

## 2013-04-13 ENCOUNTER — Other Ambulatory Visit: Payer: Self-pay | Admitting: Obstetrics & Gynecology

## 2013-04-13 DIAGNOSIS — Z1151 Encounter for screening for human papillomavirus (HPV): Secondary | ICD-10-CM | POA: Insufficient documentation

## 2013-04-13 DIAGNOSIS — Z01419 Encounter for gynecological examination (general) (routine) without abnormal findings: Secondary | ICD-10-CM | POA: Insufficient documentation

## 2013-06-10 ENCOUNTER — Ambulatory Visit: Payer: 59

## 2013-06-10 ENCOUNTER — Ambulatory Visit: Payer: 59 | Admitting: Hematology and Oncology

## 2013-06-11 ENCOUNTER — Telehealth: Payer: Self-pay | Admitting: Hematology and Oncology

## 2013-06-11 ENCOUNTER — Ambulatory Visit: Payer: 59

## 2013-06-11 ENCOUNTER — Ambulatory Visit (HOSPITAL_BASED_OUTPATIENT_CLINIC_OR_DEPARTMENT_OTHER): Payer: 59 | Admitting: Hematology and Oncology

## 2013-06-11 ENCOUNTER — Encounter: Payer: Self-pay | Admitting: Hematology and Oncology

## 2013-06-11 VITALS — BP 136/77 | HR 84 | Temp 98.2°F | Resp 20 | Ht 61.0 in | Wt 201.8 lb

## 2013-06-11 DIAGNOSIS — D473 Essential (hemorrhagic) thrombocythemia: Secondary | ICD-10-CM

## 2013-06-11 DIAGNOSIS — D509 Iron deficiency anemia, unspecified: Secondary | ICD-10-CM

## 2013-06-11 DIAGNOSIS — D649 Anemia, unspecified: Secondary | ICD-10-CM

## 2013-06-11 DIAGNOSIS — N920 Excessive and frequent menstruation with regular cycle: Secondary | ICD-10-CM

## 2013-06-11 NOTE — Telephone Encounter (Signed)
gv adn printed appt sched and avs for pt fro May °

## 2013-06-11 NOTE — Progress Notes (Signed)
Checked in new pt with no financial concerns. °

## 2013-06-11 NOTE — Progress Notes (Signed)
[facility] CONSULT NOTE  Patient Care Team: Kandice Hams, MD as PCP - General (Internal Medicine)  CHIEF COMPLAINTS/PURPOSE OF CONSULTATION:  Severe iron deficiency anemia, likely due to menorrhagia  HISTORY OF PRESENTING ILLNESS:  Barbara Barrera 38 y.o. female is here because of severe iron deficiency anemia.  I had the opportunity to review her blood count from 2011 and at that time she was not anemic. In May of 2012, she was anemic with a hemoglobin of 10.8. On 03/02/2013, the hemoglobin dropped to 8.7. Additional workup did not support the diagnosis of hemolysis. Her MCV was very low. Iron studies suggested severe iron deficiency anemia with a ferritin of 6. Serum vitamin B12 and folate level were were adequate. Her latest CBC showed reactive thrombocytosis.  She denies recent chest pain on exertion but complained of shortness of breath on minimal exertion, recent syncopal episodes, profound fatigue and dizziness. She had not noticed any recent bleeding such as epistaxis or hematuria but did complain of occasional hematochezia. She denies reflux. She had a colonoscopy recently that did not reveal abnormalities. She had significant menorrhagia over the last few years. She would routinely bleed 10-11 days with each cycle for 30 days. The first 7-8 days are associated with heavy bleeding and she would need to change her sanitary pad almost on an hourly basis. The patient had history of abnormal Pap smear and had a cone biopsy in the past which excluded cervical cancer. She had 2 prior pregnancies but was never told she was anemic during pregnancies. The patient denies over the counter NSAID ingestion. She is not on antiplatelets agents. Her last colonoscopy was recent. She complained of excessive pica with chewing of ice. In general, she eats a variety of diet. She had donated blood many times in the past, more than 10 times that she could recall. She was denied blood  transfusions several years ago. She had never received blood transfusion The patient was prescribed oral iron supplements and she takes once a day. She developed severe nausea and gastritis and does not want to attempt any further oral ion supplements. She was referred here for intravenous iron infusion.  MEDICAL HISTORY:  Past Medical History  Diagnosis Date  . Anemia     SURGICAL HISTORY: Past Surgical History  Procedure Laterality Date  . Cesarean section    . Tubal ligation      SOCIAL HISTORY: History   Social History  . Marital Status: Married    Spouse Name: N/A    Number of Children: N/A  . Years of Education: N/A   Occupational History  . Not on file.   Social History Main Topics  . Smoking status: Current Every Day Smoker -- 0.50 packs/day for 18 years  . Smokeless tobacco: Never Used  . Alcohol Use: Yes  . Drug Use: No  . Sexual Activity: Not on file   Other Topics Concern  . Not on file   Social History Narrative  . No narrative on file    FAMILY HISTORY: Family History  Problem Relation Age of Onset  . Cancer Maternal Aunt     abdominal ca    ALLERGIES:  has No Known Allergies.  MEDICATIONS:  No current outpatient prescriptions on file.   No current facility-administered medications for this visit.    REVIEW OF SYSTEMS:   Constitutional: Denies fevers, chills or abnormal night sweats Eyes: Denies blurriness of vision, double vision or watery eyes Ears, nose, mouth, throat, and  face: Denies mucositis or sore throat Respiratory: Denies cough, dyspnea or wheezes Cardiovascular: Denies palpitation, chest discomfort or lower extremity swelling Gastrointestinal:  Denies nausea, heartburn or change in bowel habits Skin: Denies abnormal skin rashes Lymphatics: Denies new lymphadenopathy or easy bruising Neurological:Denies numbness, tingling or new weaknesses Behavioral/Psych: Mood is stable, no new changes  All other systems were reviewed  with the patient and are negative.  PHYSICAL EXAMINATION: ECOG PERFORMANCE STATUS: 1 - Symptomatic but completely ambulatory  Filed Vitals:   06/11/13 1343  BP: 136/77  Pulse: 84  Temp: 98.2 F (36.8 C)  Resp: 20   Filed Weights   06/11/13 1343  Weight: 201 lb 12.8 oz (91.536 kg)    GENERAL:alert, no distress and comfortable. She is obese SKIN: skin color, texture, turgor are normal, no rashes or significant lesions EYES: normal, conjunctiva are pale and non-injected, sclera clear OROPHARYNX:no exudate, no erythema and lips, buccal mucosa, and tongue normal  NECK: supple, thyroid normal size, non-tender, without nodularity LYMPH:  no palpable lymphadenopathy in the cervical, axillary or inguinal LUNGS: clear to auscultation and percussion with normal breathing effort HEART: regular rate & rhythm and no murmurs and no lower extremity edema ABDOMEN:abdomen soft, non-tender and normal bowel sounds Musculoskeletal:no cyanosis of digits and no clubbing  PSYCH: alert & oriented x 3 with fluent speech NEURO: no focal motor/sensory deficits ASSESSMENT & PLAN:  #1 Anemia #2 severe iron deficiency with associated reactive thrombocytosis #3 severe menorrhagia  The most likely cause of her anemia is due to chronic blood loss. She had donated blood in the past which could have depleted her iron stores. Over the past 2 years, she started to have heavy menstruation which is the most likely cause of her current severe iron deficiency anemia. The elevated platelet count is reactive in nature. We discussed some of the risks, benefits, and alternatives of intravenous iron infusions. The patient is symptomatic from anemia and the iron level is critically low. She tolerated oral iron supplement poorly and desires to achieved higher levels of iron faster for adequate hematopoesis. Some of the side-effects to be expected including risks of infusion reactions, phlebitis, headaches, nausea and fatigue.   The patient is willing to proceed. Patient education material was dispensed.  Goal is to keep ferritin level greater than 50  I would proceed with intravenous iron infusion tomorrow. I recommend she has repeat CBC with ferritin rechecked in about a month. If the repeat ferritin level is less than 100, the patient will call me and we will see her back and proceed with further intravenous iron infusion. According to the patient, the long-term plan would be to proceed with hysterectomy for heavy menstruation. I would defer to her gynecologist for further management.  All questions were answered. The patient knows to call the clinic with any problems, questions or concerns. I spent 40 minutes counseling the patient face to face. The total time spent in the appointment was 55 minutes and more than 50% was on counseling.     Heath Lark, MD 06/11/2013 8:41 PM

## 2013-06-12 ENCOUNTER — Ambulatory Visit (HOSPITAL_BASED_OUTPATIENT_CLINIC_OR_DEPARTMENT_OTHER): Payer: 59

## 2013-06-12 VITALS — BP 132/86 | HR 78 | Temp 97.5°F | Resp 18

## 2013-06-12 DIAGNOSIS — D649 Anemia, unspecified: Secondary | ICD-10-CM

## 2013-06-12 DIAGNOSIS — D509 Iron deficiency anemia, unspecified: Secondary | ICD-10-CM

## 2013-06-12 MED ORDER — SODIUM CHLORIDE 0.9 % IV SOLN
Freq: Once | INTRAVENOUS | Status: AC
  Administered 2013-06-12: 10:00:00 via INTRAVENOUS

## 2013-06-12 MED ORDER — SODIUM CHLORIDE 0.9 % IV SOLN
1020.0000 mg | Freq: Once | INTRAVENOUS | Status: AC
  Administered 2013-06-12: 1020 mg via INTRAVENOUS
  Filled 2013-06-12: qty 34

## 2013-06-12 NOTE — Patient Instructions (Signed)

## 2013-12-02 ENCOUNTER — Other Ambulatory Visit: Payer: Self-pay | Admitting: Hematology and Oncology

## 2023-12-23 DEATH — deceased
